# Patient Record
Sex: Male | Born: 1980 | Race: White | Hispanic: No | Marital: Single | Smoking: Never smoker
Health system: Southern US, Community
[De-identification: ages and names within clinical notes are randomized; demographics above are authoritative.]

## PROBLEM LIST (undated history)

## (undated) DIAGNOSIS — Z21 Asymptomatic human immunodeficiency virus [HIV] infection status: Secondary | ICD-10-CM

## (undated) DIAGNOSIS — B20 Human immunodeficiency virus [HIV] disease: Secondary | ICD-10-CM

## (undated) HISTORY — DX: Asymptomatic human immunodeficiency virus (hiv) infection status: Z21

## (undated) HISTORY — DX: Human immunodeficiency virus (HIV) disease: B20

---

## 2018-12-15 ENCOUNTER — Encounter: Payer: Self-pay | Admitting: Infectious Diseases

## 2018-12-20 ENCOUNTER — Other Ambulatory Visit: Payer: Self-pay | Admitting: Behavioral Health

## 2018-12-20 ENCOUNTER — Other Ambulatory Visit: Payer: Self-pay

## 2018-12-20 ENCOUNTER — Ambulatory Visit: Payer: Self-pay

## 2018-12-20 DIAGNOSIS — B20 Human immunodeficiency virus [HIV] disease: Secondary | ICD-10-CM

## 2018-12-20 DIAGNOSIS — Z79899 Other long term (current) drug therapy: Secondary | ICD-10-CM

## 2018-12-20 DIAGNOSIS — Z113 Encounter for screening for infections with a predominantly sexual mode of transmission: Secondary | ICD-10-CM

## 2018-12-21 LAB — URINALYSIS
Bilirubin Urine: NEGATIVE
Glucose, UA: NEGATIVE
Hgb urine dipstick: NEGATIVE
Ketones, ur: NEGATIVE
Leukocytes,Ua: NEGATIVE
Nitrite: NEGATIVE
Protein, ur: NEGATIVE
Specific Gravity, Urine: 1.018 (ref 1.001–1.03)
pH: 6.5 (ref 5.0–8.0)

## 2018-12-21 LAB — T-HELPER CELL (CD4) - (RCID CLINIC ONLY)
CD4 % Helper T Cell: 15 % — ABNORMAL LOW (ref 33–55)
CD4 T Cell Abs: 390 /uL — ABNORMAL LOW (ref 400–2700)

## 2018-12-21 LAB — URINE CYTOLOGY ANCILLARY ONLY
Chlamydia: NEGATIVE
Neisseria Gonorrhea: NEGATIVE

## 2018-12-21 NOTE — Progress Notes (Signed)
Does this patient have a history of prior syphilis? If not, he will need to be notified of his Dx and have his appointment moved forward, so we can determine if he's at risk for neurosyphilis and which treatment algorithm should be pursued.

## 2018-12-22 ENCOUNTER — Encounter: Payer: Self-pay | Admitting: *Deleted

## 2018-12-22 ENCOUNTER — Telehealth: Payer: Self-pay | Admitting: *Deleted

## 2018-12-22 NOTE — Telephone Encounter (Addendum)
RN left message for patient.  He called back, states he was treated mid last year, thinks it was one dose of bicillin. He feels it was in [place], [place].  He will call that provider's office and ask them to send his records to our attention.  RN sent patient MyChart Login code to his cell phone so that he can relay provider information. Andree Coss, RN   Author: Nemacolin Lions, MD Author Type: Physician Filed: 12/21/2018 10:01 AM  Note Status: Signed Cosign: Cosign Not Required Encounter Date: 12/20/2018  Editor: Marblemount Lions, MD (Physician)    Does this patient have a history of prior syphilis? If not, he will need to be notified of his Dx and have his appointment moved forward, so we can determine if he's at risk for neurosyphilis and which treatment algorithm should be pursued.

## 2018-12-26 LAB — COMPLETE METABOLIC PANEL WITH GFR
AG Ratio: 1.4 (calc) (ref 1.0–2.5)
ALT: 15 U/L (ref 9–46)
AST: 17 U/L (ref 10–40)
Albumin: 4.3 g/dL (ref 3.6–5.1)
Alkaline phosphatase (APISO): 50 U/L (ref 36–130)
BUN: 10 mg/dL (ref 7–25)
CO2: 25 mmol/L (ref 20–32)
Calcium: 9.6 mg/dL (ref 8.6–10.3)
Chloride: 107 mmol/L (ref 98–110)
Creat: 0.85 mg/dL (ref 0.60–1.35)
GFR, Est African American: 129 mL/min/{1.73_m2} (ref >=60)
GFR, Est Non African American: 111 mL/min/{1.73_m2} (ref >=60)
Globulin: 3.1 g/dL (calc) (ref 1.9–3.7)
Glucose, Bld: 82 mg/dL (ref 65–99)
Potassium: 4.2 mmol/L (ref 3.5–5.3)
Sodium: 140 mmol/L (ref 135–146)
Total Bilirubin: 0.4 mg/dL (ref 0.2–1.2)
Total Protein: 7.4 g/dL (ref 6.1–8.1)

## 2018-12-26 LAB — HEPATITIS C ANTIBODY
Hepatitis C Ab: NONREACTIVE
SIGNAL TO CUT-OFF: 0.06 (ref <1.00)

## 2018-12-26 LAB — LIPID PANEL
Cholesterol: 133 mg/dL (ref <200)
HDL: 37 mg/dL — ABNORMAL LOW (ref >=40)
LDL Cholesterol (Calc): 77 mg/dL (calc)
Non-HDL Cholesterol (Calc): 96 mg/dL (calc) (ref <130)
Total CHOL/HDL Ratio: 3.6 (calc) (ref <5.0)
Triglycerides: 109 mg/dL (ref <150)

## 2018-12-26 LAB — HIV ANTIBODY (ROUTINE TESTING W REFLEX): HIV 1&2 Ab, 4th Generation: REACTIVE — AB

## 2018-12-26 LAB — CBC WITH DIFFERENTIAL/PLATELET
Absolute Monocytes: 382 cells/uL (ref 200–950)
Basophils Absolute: 23 cells/uL (ref 0–200)
Basophils Relative: 0.4 %
Eosinophils Absolute: 80 cells/uL (ref 15–500)
Eosinophils Relative: 1.4 %
HCT: 42 % (ref 38.5–50.0)
Hemoglobin: 14.8 g/dL (ref 13.2–17.1)
Lymphs Abs: 2645 cells/uL (ref 850–3900)
MCH: 32.9 pg (ref 27.0–33.0)
MCHC: 35.2 g/dL (ref 32.0–36.0)
MCV: 93.3 fL (ref 80.0–100.0)
MPV: 11 fL (ref 7.5–12.5)
Monocytes Relative: 6.7 %
Neutro Abs: 2571 cells/uL (ref 1500–7800)
Neutrophils Relative %: 45.1 %
Platelets: 192 10*3/uL (ref 140–400)
RBC: 4.5 10*6/uL (ref 4.20–5.80)
RDW: 12.6 % (ref 11.0–15.0)
Total Lymphocyte: 46.4 %
WBC: 5.7 10*3/uL (ref 3.8–10.8)

## 2018-12-26 LAB — HEPATITIS B CORE ANTIBODY, TOTAL: Hep B Core Total Ab: NONREACTIVE

## 2018-12-26 LAB — FLUORESCENT TREPONEMAL AB(FTA)-IGG-BLD: Fluorescent Treponemal ABS: REACTIVE — AB

## 2018-12-26 LAB — QUANTIFERON-TB GOLD PLUS
Mitogen-NIL: >10 IU/mL
NIL: 0.02 IU/mL
QuantiFERON-TB Gold Plus: NEGATIVE
TB1-NIL: 0 IU/mL
TB2-NIL: 0 IU/mL

## 2018-12-26 LAB — HEPATITIS B SURFACE ANTIGEN: Hepatitis B Surface Ag: NONREACTIVE

## 2018-12-26 LAB — HEPATITIS A ANTIBODY, TOTAL: Hepatitis A AB,Total: NONREACTIVE

## 2018-12-26 LAB — RPR: RPR Ser Ql: REACTIVE — AB

## 2018-12-26 LAB — HIV-1/2 AB - DIFFERENTIATION
HIV-1 antibody: POSITIVE — AB
HIV-2 Ab: NEGATIVE

## 2018-12-26 LAB — HIV-1 RNA ULTRAQUANT REFLEX TO GENTYP+
HIV 1 RNA Quant: 273 copies/mL — ABNORMAL HIGH
HIV-1 RNA Quant, Log: 2.44 Log copies/mL — ABNORMAL HIGH

## 2018-12-26 LAB — HLA B*5701: HLA-B*5701 w/rflx HLA-B High: NEGATIVE

## 2018-12-26 LAB — RPR TITER: RPR Titer: 1:32 {titer} — ABNORMAL HIGH

## 2018-12-26 LAB — HEPATITIS B SURFACE ANTIBODY,QUALITATIVE: Hep B S Ab: NONREACTIVE

## 2018-12-26 NOTE — Telephone Encounter (Addendum)
RN left message attempting to follow up on patient's syphilis treatment history. He is scheduled in Dr Trevor Mace first available clinic day.

## 2018-12-28 NOTE — Telephone Encounter (Signed)
Albert from [facility] called stating [name] was treated for Syphilis starting 10/08/2009 in the [facility] with 3 doses of Bicillin. RPR was 1:64 at the time.  Per notes patient was treated in [place] last year.  Mathis Fare states he will do a records search for [place] to see when patient was last treated there.  He will call back with an update. Angeline Slim RN

## 2018-12-29 NOTE — Telephone Encounter (Signed)
Sure thing! I am hopeful he brings his records from [place] to the appointment 5/5, but will follow up with Mathis Fare at [facility] to see if he was able to find any treatment.

## 2018-12-29 NOTE — Telephone Encounter (Signed)
It looks more like he may have a chronically elevated titer post-treatment perhaps. It would be helpful to have his RPR titer from [place] last year in that case to confirm. Thanks for tracking this down.

## 2019-01-03 ENCOUNTER — Encounter: Payer: Self-pay | Admitting: Infectious Diseases

## 2019-01-03 ENCOUNTER — Ambulatory Visit: Payer: Self-pay | Admitting: Pharmacist

## 2019-01-03 NOTE — Telephone Encounter (Signed)
He no-showed for 2 appointment times today with me. As we now know he had a negative RPR in [place] last year, we'll need to assess him for sxs of neurosyphilis to see if he needs an LP and 2 weeks of IV PCN or 3 weekly PCN injections IM as he would be a re-treatment.

## 2019-01-03 NOTE — Telephone Encounter (Signed)
Per [facility] record search, patient tested for syphilis in [facility] 07/06/2018, RPR was negative. Andree Coss, RN

## 2019-01-04 NOTE — Telephone Encounter (Signed)
Spoke with Erie Noe at [facility] letting her know that the patient no-showed his visits and we hav enot been able to get in touch with him to start treatment for syphilis.  Per Shawna Orleans is the [facility] agent working with Yassir's case. She will let Melton Alar know and ask for her help to 1) get him into care or 2) refer to the [employer]. Andree Coss, RN

## 2019-02-11 ENCOUNTER — Emergency Department (HOSPITAL_COMMUNITY): Payer: Self-pay

## 2019-02-11 ENCOUNTER — Emergency Department (HOSPITAL_COMMUNITY)
Admission: EM | Admit: 2019-02-11 | Discharge: 2019-02-11 | Discharge status: Against Medical Advice | Payer: Self-pay | Attending: Emergency Medicine | Admitting: Emergency Medicine

## 2019-02-11 ENCOUNTER — Encounter (HOSPITAL_COMMUNITY): Payer: Self-pay | Admitting: Emergency Medicine

## 2019-02-11 DIAGNOSIS — T50901A Poisoning by unspecified drugs, medicaments and biological substances, accidental (unintentional), initial encounter: Secondary | ICD-10-CM | POA: Insufficient documentation

## 2019-02-11 DIAGNOSIS — Z5329 Procedure and treatment not carried out because of patient's decision for other reasons: Secondary | ICD-10-CM | POA: Insufficient documentation

## 2019-02-11 LAB — CBC WITH DIFFERENTIAL/PLATELET
Abs Immature Granulocytes: 0 10*3/uL (ref 0.00–0.07)
Basophils Absolute: 0 10*3/uL (ref 0.0–0.1)
Basophils Relative: 0 %
Eosinophils Absolute: 0.1 10*3/uL (ref 0.0–0.5)
Eosinophils Relative: 2 %
HCT: 42.9 % (ref 39.0–52.0)
Hemoglobin: 14.5 g/dL (ref 13.0–17.0)
Immature Granulocytes: 0 %
Lymphocytes Relative: 55 %
Lymphs Abs: 2.8 10*3/uL (ref 0.7–4.0)
MCH: 33.2 pg (ref 26.0–34.0)
MCHC: 33.8 g/dL (ref 30.0–36.0)
MCV: 98.2 fL (ref 80.0–100.0)
Monocytes Absolute: 0.5 10*3/uL (ref 0.1–1.0)
Monocytes Relative: 9 %
Neutro Abs: 1.7 10*3/uL (ref 1.7–7.7)
Neutrophils Relative %: 34 %
Platelets: 199 10*3/uL (ref 150–400)
RBC: 4.37 MIL/uL (ref 4.22–5.81)
RDW: 13.1 % (ref 11.5–15.5)
WBC: 5 10*3/uL (ref 4.0–10.5)
nRBC: 0 % (ref 0.0–0.2)

## 2019-02-11 LAB — RAPID URINE DRUG SCREEN, HOSP PERFORMED
Amphetamines: POSITIVE — AB
Barbiturates: NOT DETECTED
Benzodiazepines: NOT DETECTED
Cocaine: NOT DETECTED
Opiates: NOT DETECTED
Tetrahydrocannabinol: NOT DETECTED

## 2019-02-11 LAB — ACETAMINOPHEN LEVEL: Acetaminophen (Tylenol), Serum: <10 ug/mL — ABNORMAL LOW (ref 10–30)

## 2019-02-11 LAB — COMPREHENSIVE METABOLIC PANEL
ALT: 18 U/L (ref 0–44)
AST: 19 U/L (ref 15–41)
Albumin: 4.3 g/dL (ref 3.5–5.0)
Alkaline Phosphatase: 51 U/L (ref 38–126)
Anion gap: 9 (ref 5–15)
BUN: 13 mg/dL (ref 6–20)
CO2: 24 mmol/L (ref 22–32)
Calcium: 9.4 mg/dL (ref 8.9–10.3)
Chloride: 111 mmol/L (ref 98–111)
Creatinine, Ser: 0.86 mg/dL (ref 0.61–1.24)
GFR calc Af Amer: >60 mL/min (ref >60)
GFR calc non Af Amer: >60 mL/min (ref >60)
Glucose, Bld: 94 mg/dL (ref 70–99)
Potassium: 3.2 mmol/L — ABNORMAL LOW (ref 3.5–5.1)
Sodium: 144 mmol/L (ref 135–145)
Total Bilirubin: 0.7 mg/dL (ref 0.3–1.2)
Total Protein: 7.7 g/dL (ref 6.5–8.1)

## 2019-02-11 LAB — URINALYSIS, ROUTINE W REFLEX MICROSCOPIC
Glucose, UA: NEGATIVE mg/dL
Hgb urine dipstick: NEGATIVE
Ketones, ur: 15 mg/dL — AB
Leukocytes,Ua: NEGATIVE
Nitrite: NEGATIVE
Protein, ur: NEGATIVE mg/dL
Specific Gravity, Urine: >1.03 — ABNORMAL HIGH (ref 1.005–1.030)
pH: 6 (ref 5.0–8.0)

## 2019-02-11 LAB — ETHANOL: Alcohol, Ethyl (B): <10 mg/dL (ref <10)

## 2019-02-11 LAB — AMMONIA: Ammonia: 39 umol/L — ABNORMAL HIGH (ref 9–35)

## 2019-02-11 LAB — SALICYLATE LEVEL: Salicylate Lvl: <7 mg/dL (ref 2.8–30.0)

## 2019-02-11 LAB — TROPONIN I: Troponin I: <0.03 ng/mL (ref <0.03)

## 2019-02-11 LAB — LACTIC ACID, PLASMA: Lactic Acid, Venous: 1 mmol/L (ref 0.5–1.9)

## 2019-02-11 MED ORDER — NALOXONE HCL 2 MG/2ML IJ SOSY
PREFILLED_SYRINGE | INTRAMUSCULAR | Status: AC
  Filled 2019-02-11: qty 2

## 2019-02-11 NOTE — ED Notes (Signed)
Pt demanding to leave  Provider notified  Pt signed out [facility]

## 2019-02-11 NOTE — ED Notes (Signed)
Bed: RESB Expected date:  Expected time:  Means of arrival:  Comments: EMS Unresponsive

## 2019-02-11 NOTE — ED Provider Notes (Addendum)
[facility]   CSN: [id number] Arrival date & time: 02/11/19  [id number]    History   Chief Complaint Chief Complaint  Patient presents with  . Drug Overdose  . Unresponsive    HPI Shawn Proctor is a 38 y.o. male.     Patient brought to the emergency department by EMS for possible overdose.  Patient was reportedly at a local hotel when staff noted that he was "tearing up the room".  Police were called as well as EMS.  EMS report that at that point when they arrived at the scene, patient appeared sedated but was ambulatory in the hall and talking.  They loaded him in the ambulance and by the time he has arrived at the ER he is somnolent and obtunded. Level V Caveat due to mental status change.     History reviewed. No pertinent past medical history.  There are no active problems to display for this patient.         Home Medications    Prior to Admission medications   Not on File    Family History No family history on file.  Social History Social History   Tobacco Use  . Smoking status: Not on file  Substance Use Topics  . Alcohol use: Not on file  . Drug use: Not on file     Allergies   Patient has no allergy information on record.   Review of Systems Review of Systems  Unable to perform ROS: Mental status change     Physical Exam Updated Vital Signs BP 126/81   Pulse (!) 59   Resp 14   SpO2 99%   Physical Exam Vitals signs and nursing note reviewed.  Constitutional:      General: He is not in acute distress.    Appearance: Normal appearance. He is well-developed.  HENT:     Head: Normocephalic and atraumatic.     Right Ear: Hearing normal.     Left Ear: Hearing normal.     Nose: Nose normal.  Eyes:     Conjunctiva/sclera: Conjunctivae normal.     Pupils: Pupils are equal, round, and reactive to light.     Comments: 2-53mm, sluggish bilat  Neck:     Musculoskeletal: Normal range of motion  and neck supple.  Cardiovascular:     Rate and Rhythm: Regular rhythm.     Heart sounds: S1 normal and S2 normal. No murmur. No friction rub. No gallop.   Pulmonary:     Effort: Pulmonary effort is normal. No respiratory distress.     Breath sounds: Normal breath sounds.  Chest:     Chest wall: No tenderness.  Abdominal:     General: Bowel sounds are normal.     Palpations: Abdomen is soft.     Tenderness: There is no abdominal tenderness. There is no guarding or rebound. Negative signs include Murphy's sign and McBurney's sign.     Hernia: No hernia is present.  Musculoskeletal: Normal range of motion.  Skin:    General: Skin is warm and dry.     Findings: No rash.  Neurological:     Mental Status: He is lethargic.     GCS: GCS eye subscore is 4. GCS verbal subscore is 5. GCS motor subscore is 6.     Cranial Nerves: No cranial nerve deficit.     Sensory: No sensory deficit.     Coordination: Coordination normal.  Psychiatric:  Speech: He is noncommunicative.      ED Treatments / Results  Labs (all labs ordered are listed, but only abnormal results are displayed) Labs Reviewed  URINALYSIS, ROUTINE W REFLEX MICROSCOPIC - Abnormal; Notable for the following components:      Result Value   Color, Urine AMBER (*)    APPearance HAZY (*)    Specific Gravity, Urine >1.030 (*)    Bilirubin Urine MODERATE (*)    Ketones, ur 15 (*)    All other components within normal limits  RAPID URINE DRUG SCREEN, HOSP PERFORMED - Abnormal; Notable for the following components:   Amphetamines POSITIVE (*)    All other components within normal limits  AMMONIA - Abnormal; Notable for the following components:   Ammonia 39 (*)    All other components within normal limits  CBC WITH DIFFERENTIAL/PLATELET  LACTIC ACID, PLASMA  COMPREHENSIVE METABOLIC PANEL  TROPONIN I  ETHANOL  SALICYLATE LEVEL  ACETAMINOPHEN LEVEL    EKG EKG Interpretation  Date/Time:  Saturday February 11 2019  05:44:41 EDT Ventricular Rate:  62 PR Interval:    QRS Duration: 107 QT Interval:  399 QTC Calculation: 406 R Axis:   61 Text Interpretation:  Sinus rhythm Normal ECG Confirmed by Orpah Greek [phone]) on 02/11/2019 5:53:15 AM   Radiology No results found.  Procedures Procedures (including critical care time)  Medications Ordered in ED Medications  naloxone (NARCAN) 2 MG/2ML injection (  Hold 02/11/19 0555)     Initial Impression / Assessment and Plan / ED Course  I have reviewed the triage vital signs and the nursing notes.  Pertinent labs & imaging results that were available during my care of the patient were reviewed by me and considered in my medical decision making (see chart for details).        Patient brought into the emergency department mostly unresponsive.  EMS report that they were called to the scene of an agitated patient had a local hotel.  He was found with some pills crushed up on a plate, identity of the pills was not known.  He responded to painful stimuli at arrival but not completely wake up initially.  After he was here approximately 45 minutes he woke up on his own and was able to answer questions.  He reports that he was in the hotel with another male, not sure who it was.  He was snorting GHB.  This was recreational use, he was not intending to harm himself.  He has had waxing and waning amount of alertness here in the ER.  Poison control recommends 8 hours of observation and supportive care.  Will be monitored in the ER, signed out to oncoming ER physician to disposition when more awake and alert after appropriate amount of monitoring.  Addendum 6:50 AM -patient now fully awake and alert.  He admits to the drugs that are positive in his system as well as the GHB.  He was not trying to harm himself.  He is insistent that he leave.  I have had multiple conversations with him, as has his nurse.  He is adamant that he will not stay for further  observation.  As he was not trying to harm himself and he is fully awake and alert now, he cannot be held against his will.  He will be discharged [place].  Final Clinical Impressions(s) / ED Diagnoses   Final diagnoses:  Accidental drug overdose, initial encounter    ED Discharge Orders  None       Gilda CreasePollina, Rhianna Raulerson J, MD 02/11/19 [id number]    Gilda CreasePollina, Jovan Schickling J, MD 02/11/19 [phone]

## 2019-02-11 NOTE — ED Notes (Signed)
Called PC  8 hr observation  With fluids and benzo's for withdrawls  Watch for brady

## 2019-02-11 NOTE — ED Notes (Signed)
Patient transported to CT 

## 2019-02-11 NOTE — ED Notes (Signed)
EKG given to EDP, Pollina,MD. For review. 

## 2019-02-11 NOTE — ED Triage Notes (Signed)
Police called for tearing up room at motel, ems arrives found pt unresponsive, sweaty,  Pt found in motel unresponsive tonight/ this morning. Pt brought in by ems.

## 2019-03-16 ENCOUNTER — Ambulatory Visit: Payer: Self-pay

## 2019-03-16 ENCOUNTER — Encounter: Payer: Self-pay | Admitting: Family

## 2019-03-16 ENCOUNTER — Other Ambulatory Visit: Payer: Self-pay

## 2019-03-16 ENCOUNTER — Ambulatory Visit (INDEPENDENT_AMBULATORY_CARE_PROVIDER_SITE_OTHER): Payer: Self-pay | Admitting: Pharmacist

## 2019-03-16 ENCOUNTER — Ambulatory Visit (INDEPENDENT_AMBULATORY_CARE_PROVIDER_SITE_OTHER): Payer: Self-pay | Admitting: Family

## 2019-03-16 VITALS — BP 139/88 | HR 89 | Temp 98.5°F | Wt 197.0 lb

## 2019-03-16 DIAGNOSIS — B20 Human immunodeficiency virus [HIV] disease: Secondary | ICD-10-CM

## 2019-03-16 DIAGNOSIS — A515 Early syphilis, latent: Secondary | ICD-10-CM | POA: Insufficient documentation

## 2019-03-16 DIAGNOSIS — Z113 Encounter for screening for infections with a predominantly sexual mode of transmission: Secondary | ICD-10-CM | POA: Insufficient documentation

## 2019-03-16 MED ORDER — PENICILLIN G BENZATHINE 1200000 UNIT/2ML IM SUSP
1.2000 10*6.[IU] | Freq: Once | INTRAMUSCULAR | Status: AC
  Administered 2019-03-16: 1.2 10*6.[IU] via INTRAMUSCULAR

## 2019-03-16 MED ORDER — BICTEGRAVIR-EMTRICITAB-TENOFOV 50-200-25 MG PO TABS: 1.0000 | ORAL_TABLET | Freq: Every day | ORAL | 2 refills | Status: DC

## 2019-03-16 NOTE — Progress Notes (Signed)
Subjective:    Patient ID: Shawn Proctor, male    DOB: 12-05-1980, 38 y.o.   MRN: [id number]  Chief Complaint  Patient presents with  . New Patient (Initial Visit)    HPI:  Shawn Proctor is a 38 y.o. male with previous medical history of HIV disease presenting today to transfer/establish care for his HIV disease.  Shawn Proctor was initially diagnosed with HIV-1 prior being incarcerated in 2019. He thinks he may have been infected prior to that but is not sure. Risk factor for acquiring HIV is MSM.  Initial CD4 nadir was 139 with a viral load of 139.  Initial genotype with wild-type virus and no significant resistance.  Upon entry to incarceration he was started on Tivicay and Descovy with Bactrim for OI prophylaxis. Previously cared for in [place] and has now moved to [place]. He had been off his Tivicay and Descovy and recently been taking his roommates Biktarvy for the past 2 months but ran out last week. No adverse side effects when taking the Biktarvy.   Shawn Proctor completed his initial clinic blood work on 12/20/2018 with a viral load of 273 and CD4 count of 390.  Syphilis titer was positive at 1: 32.  Negative for acute infection with hepatitis B or C and non-serologically immune to hepatitis B.  Hepatic function, renal function, and electrolytes within normal ranges.   Shawn Proctor is not currently working but seeking work.  He is receiving financial assistance through [employer].  Denies feelings of being down, depressed, or hopeless recently.  He has quit tobacco use.  He does have recreational/illicit drug use which he wishes not to disclose at this time.  No current alcohol use.  He does remain sexually active and uses condoms.  He has concerns that he may have been exposed to syphilis as well as gonorrhea/chlamydia.    Allergies  Allergen Reactions  . Clindamycin/Lincomycin       Outpatient Medications Prior to Visit  Medication Sig Dispense Refill  .  Bictegravir-Emtricitab-Tenofov (BIKTARVY PO) Take by mouth.     No facility-administered medications prior to visit.      History reviewed. No pertinent past medical history.    History reviewed. No pertinent surgical history.    History reviewed. No pertinent family history.    Social History   Socioeconomic History  . Marital status: Single    Spouse name: Not on file  . Number of children: Not on file  . Years of education: Not on file  . Highest education level: Not on file  Occupational History  . Not on file  Social Needs  . Financial resource strain: Not on file  . Food insecurity    Worry: Not on file    Inability: Not on file  . Transportation needs    Medical: Not on file    Non-medical: Not on file  Tobacco Use  . Smoking status: Never Smoker  . Smokeless tobacco: Never Used  Substance and Sexual Activity  . Alcohol use: Not Currently  . Drug use: Not Currently  . Sexual activity: Not on file  Lifestyle  . Physical activity    Days per week: Not on file    Minutes per session: Not on file  . Stress: Not on file  Relationships  . Social Herbalist on phone: Not on file    Gets together: Not on file    Attends religious service: Not on file    Active  member of club or organization: Not on file    Attends meetings of clubs or organizations: Not on file    Relationship status: Not on file  . Intimate partner violence    Fear of current or ex partner: Not on file    Emotionally abused: Not on file    Physically abused: Not on file    Forced sexual activity: Not on file  Other Topics Concern  . Not on file  Social History Narrative  . Not on file      Review of Systems  Constitutional: Negative for appetite change, chills, fatigue, fever and unexpected weight change.  Eyes: Negative for visual disturbance.  Respiratory: Negative for cough, chest tightness, shortness of breath and wheezing.   Cardiovascular: Negative for chest  pain and leg swelling.  Gastrointestinal: Negative for abdominal pain, constipation, diarrhea, nausea and vomiting.  Genitourinary: Negative for dysuria, flank pain, frequency, genital sores, hematuria and urgency.  Skin: Negative for rash.  Allergic/Immunologic: Negative for immunocompromised state.  Neurological: Negative for dizziness and headaches.       Objective:    BP 139/88   Pulse 89   Temp 98.5 F (36.9 C) (Oral)   Wt 197 lb (89.4 kg)  Nursing note and vital signs reviewed.  Physical Exam Constitutional:      General: He is not in acute distress.    Appearance: He is well-developed.  Eyes:     Conjunctiva/sclera: Conjunctivae normal.  Neck:     Musculoskeletal: Neck supple.  Cardiovascular:     Rate and Rhythm: Normal rate and regular rhythm.     Heart sounds: Normal heart sounds. No murmur. No friction rub. No gallop.   Pulmonary:     Effort: Pulmonary effort is normal. No respiratory distress.     Breath sounds: Normal breath sounds. No wheezing or rales.  Chest:     Chest wall: No tenderness.  Abdominal:     General: Bowel sounds are normal.     Palpations: Abdomen is soft.     Tenderness: There is no abdominal tenderness.  Lymphadenopathy:     Cervical: No cervical adenopathy.  Skin:    General: Skin is warm and dry.     Findings: No rash.  Neurological:     Mental Status: He is alert and oriented to person, place, and time.  Psychiatric:        Behavior: Behavior normal.        Thought Content: Thought content normal.        Judgment: Judgment normal.         Assessment & Plan:   Problem List Items Addressed This Visit      Other   HIV disease ([place]) - Primary    Shawn Proctor was initially diagnosed with HIV-1 in 2019 with initial viral load of 3.61 million and CD4 nadir of 139. Initial genotype without significant resistance and was treated with Tivicay and Descovy. Now on Biktarvy which he has been out of for 1 month his most recent viral load  was 273 CD4 count of 390. He tolerated the Biktarvy with no adverse side effects. No current symptoms of opportunistic infection or progressive HIV disease. Discussed clinic overview and met with pharmacy staff. Restart Biktarvy. Check blood work today. Plan for follow up in 2 months or sooner if needed.       Relevant Medications   bictegravir-emtricitabine-tenofovir AF (BIKTARVY) 50-200-25 MG TABS tablet   Other Relevant Orders   COMPLETE METABOLIC PANEL WITH  GFR   T-helper cell (CD4)- ([facility] clinic only)   HIV-1 RNA quant-no reflex-bld   Early syphilis, latent    Mr. Rehm had a positive RPR titer in on 12/20/18 at 1:32 with most recent RPR titer being non-reactive in both November 2019 and also January 2019. He has no current symptoms and would classify this as early latent syphilis. Will treat with 2.4 million units of Bicillin IM once. Advised to avoid sexual contact for the next 7 days. Recheck RPR in 3 months.       Relevant Medications   bictegravir-emtricitabine-tenofovir AF (BIKTARVY) 50-200-25 MG TABS tablet   Screening for STDs (sexually transmitted diseases)    Mr. Nabers has concern for gonorrhea/chlamydia. We will obtain oral and rectal swabs to rule out infection. Initial plan to treat today, however he became dizzy following his Bicillin injections. Will have him schedule a nurse visit on Monday, 7/20, for azithromycin 1 gram PO once and ceftriaxone 250 mg IM once.       Relevant Orders   Cytology (oral, anal, urethral) ancillary only   Cytology (oral, anal, urethral) ancillary only       I have discontinued Ponciano Ort Bictegravir-Emtricitab-Tenofov (BIKTARVY PO). I am also having him start on bictegravir-emtricitabine-tenofovir AF. We administered penicillin g benzathine and penicillin g benzathine.   Meds ordered this encounter  Medications  . bictegravir-emtricitabine-tenofovir AF (BIKTARVY) 50-200-25 MG TABS tablet    Sig: Take 1 tablet by mouth daily.     Dispense:  30 tablet    Refill:  2    Order Specific Question:   Supervising Provider    Answer:   Carlyle Basques [[id number]]  . penicillin g benzathine (BICILLIN LA) 1200000 UNIT/2ML injection 1.2 Million Units  . penicillin g benzathine (BICILLIN LA) 1200000 UNIT/2ML injection 1.2 Million Units     Follow-up: Return in about 2 months (around 05/17/2019), or if symptoms worsen or fail to improve.    Terri Piedra, MSN, FNP-C Nurse Practitioner [facility] Office phone: [phone] Pager: [place] number: [phone]

## 2019-03-16 NOTE — Assessment & Plan Note (Signed)
Mr. Gladwin has concern for gonorrhea/chlamydia. We will obtain oral and rectal swabs to rule out infection. Initial plan to treat today, however he became dizzy following his Bicillin injections. Will have him schedule a nurse visit on Monday, 7/20, for azithromycin 1 gram PO once and ceftriaxone 250 mg IM once.

## 2019-03-16 NOTE — Assessment & Plan Note (Signed)
Mr. Asebedo had a positive RPR titer in on 12/20/18 at 1:32 with most recent RPR titer being non-reactive in both November 2019 and also January 2019. He has no current symptoms and would classify this as early latent syphilis. Will treat with 2.4 million units of Bicillin IM once. Advised to avoid sexual contact for the next 7 days. Recheck RPR in 3 months.

## 2019-03-16 NOTE — Progress Notes (Signed)
HPI: Shawn Proctor is a 38 y.o. male who presents to the [facility] clinic to initiate care with Tammy SoursGreg for his HIV infection.  Patient Active Problem List   Diagnosis Date Noted  . HIV disease (HCC) 03/16/2019  . Early syphilis, latent 03/16/2019  . Screening for STDs (sexually transmitted diseases) 03/16/2019    Patient's Medications  New Prescriptions   No medications on file  Previous Medications   BICTEGRAVIR-EMTRICITABINE-TENOFOVIR AF (BIKTARVY) 50-200-25 MG TABS TABLET    Take 1 tablet by mouth daily.  Modified Medications   No medications on file  Discontinued Medications   No medications on file    Allergies: Allergies  Allergen Reactions  . Clindamycin/Lincomycin     Past Medical History: No past medical history on file.  Social History: Social History   Socioeconomic History  . Marital status: Single    Spouse name: Not on file  . Number of children: Not on file  . Years of education: Not on file  . Highest education level: Not on file  Occupational History  . Not on file  Social Needs  . Financial resource strain: Not on file  . Food insecurity    Worry: Not on file    Inability: Not on file  . Transportation needs    Medical: Not on file    Non-medical: Not on file  Tobacco Use  . Smoking status: Never Smoker  . Smokeless tobacco: Never Used  Substance and Sexual Activity  . Alcohol use: Not Currently  . Drug use: Not Currently  . Sexual activity: Not on file  Lifestyle  . Physical activity    Days per week: Not on file    Minutes per session: Not on file  . Stress: Not on file  Relationships  . Social Musicianconnections    Talks on phone: Not on file    Gets together: Not on file    Attends religious service: Not on file    Active member of club or organization: Not on file    Attends meetings of clubs or organizations: Not on file    Relationship status: Not on file  Other Topics Concern  . Not on file  Social History Narrative  . Not on file     Labs: Lab Results  Component Value Date   HIV1RNAQUANT 273 (H) 12/20/2018   CD4TABS 390 (L) 12/20/2018    RPR and STI Lab Results  Component Value Date   LABRPR REACTIVE (A) 12/20/2018   RPRTITER 1:32 (H) 12/20/2018    STI Results GC CT  12/20/2018 Negative Negative    Hepatitis B Lab Results  Component Value Date   HEPBSAB NON-REACTIVE 12/20/2018   HEPBSAG NON-REACTIVE 12/20/2018   HEPBCAB NON-REACTIVE 12/20/2018   Hepatitis C Lab Results  Component Value Date   HEPCAB NON-REACTIVE 12/20/2018   Hepatitis A Lab Results  Component Value Date   HAV NON-REACTIVE 12/20/2018   Lipids: Lab Results  Component Value Date   CHOL 133 12/20/2018   TRIG 109 12/20/2018   HDL 37 (L) 12/20/2018   CHOLHDL 3.6 12/20/2018   LDLCALC 77 12/20/2018     Assessment: Shawn Proctor is here today to initiate care with Tammy SoursGreg.  He was previously on Tivicay and Descovy but has been taking his roommate's Biktarvy for several months.  He has been out for a week. Will continue patient on Biktarvy.  Explained that Susanne BordersBiktarvy is a one pill once daily medication with or without food and the importance of not missing any  doses. Explained resistance and how it develops and why it is so important to take Biktarvy daily and not skip days or doses. Counseled patient to take it around the same time each day. Counseled on what to do if dose is missed, if closer to missed dose take immediately, if closer to next dose then skip and resume normal schedule.   Cautioned on possible side effects the first week or so including nausea, diarrhea, dizziness, and headaches but that they should resolve after the first couple of weeks. I reviewed patient medications and found no drug interactions. Counseled patient to separate Biktarvy from divalent cations including multivitamins. Discussed with patient to call clinic if he starts a new medication or herbal supplement. I gave the patient my card and told he to call me with  any issues/questions/concerns.  Patient is approved for [employer] and will get his [place] at [facility].  Plan: - Continue Biktarvy PO once daily  Tempie Gibeault L. Neena Beecham, PharmD, BCIDP, AAHIVP, [facility] Infect[facility] 03/16/2019, 3:58 PM

## 2019-03-16 NOTE — Patient Instructions (Signed)
Nice to meet you.  We will check your blood work today.  Restart taking the Biktarvy.  We will recheck your RPR for syphilis in 3 months.   Biktarvy has been sent to the pharmacy.  Plan for follow up in 2 months or sooner if needed. Lab work 1-2 weeks prior to your appointment.   Have a great day and stay safe!

## 2019-03-16 NOTE — Assessment & Plan Note (Signed)
Shawn Proctor was initially diagnosed with HIV-1 in 2019 with initial viral load of 3.61 million and CD4 nadir of 139. Initial genotype without significant resistance and was treated with Tivicay and Descovy. Now on Biktarvy which he has been out of for 1 month his most recent viral load was 273 CD4 count of 390. He tolerated the Biktarvy with no adverse side effects. No current symptoms of opportunistic infection or progressive HIV disease. Discussed clinic overview and met with pharmacy staff. Restart Biktarvy. Check blood work today. Plan for follow up in 2 months or sooner if needed.

## 2019-03-17 ENCOUNTER — Other Ambulatory Visit: Payer: Self-pay | Admitting: *Deleted

## 2019-03-17 DIAGNOSIS — Z113 Encounter for screening for infections with a predominantly sexual mode of transmission: Secondary | ICD-10-CM

## 2019-03-17 DIAGNOSIS — B20 Human immunodeficiency virus [HIV] disease: Secondary | ICD-10-CM

## 2019-03-20 ENCOUNTER — Other Ambulatory Visit: Payer: Self-pay

## 2019-03-20 ENCOUNTER — Ambulatory Visit (INDEPENDENT_AMBULATORY_CARE_PROVIDER_SITE_OTHER): Payer: Self-pay | Admitting: *Deleted

## 2019-03-20 DIAGNOSIS — B20 Human immunodeficiency virus [HIV] disease: Secondary | ICD-10-CM

## 2019-03-20 DIAGNOSIS — A64 Unspecified sexually transmitted disease: Secondary | ICD-10-CM

## 2019-03-20 MED ORDER — CEFTRIAXONE SODIUM 250 MG IJ SOLR
250.0000 mg | Freq: Once | INTRAMUSCULAR | Status: AC
  Administered 2019-03-20: 250 mg via INTRAMUSCULAR

## 2019-03-20 MED ORDER — AZITHROMYCIN 250 MG PO TABS
1000.0000 mg | ORAL_TABLET | Freq: Once | ORAL | Status: AC
  Administered 2019-03-20: 15:00:00 1000 mg via ORAL

## 2019-03-21 LAB — CYTOLOGY, (ORAL, ANAL, URETHRAL) ANCILLARY ONLY
Chlamydia: NEGATIVE
Neisseria Gonorrhea: NEGATIVE

## 2019-03-21 LAB — T-HELPER CELL (CD4) - (RCID CLINIC ONLY)
CD4 % Helper T Cell: 16 % — ABNORMAL LOW (ref 33–65)
CD4 T Cell Abs: 418 /uL (ref 400–1790)

## 2019-03-22 LAB — CYTOLOGY, (ORAL, ANAL, URETHRAL) ANCILLARY ONLY
Chlamydia: NEGATIVE
Neisseria Gonorrhea: NEGATIVE

## 2019-03-24 LAB — COMPLETE METABOLIC PANEL WITH GFR
AG Ratio: 1.5 (calc) (ref 1.0–2.5)
ALT: 19 U/L (ref 9–46)
AST: 19 U/L (ref 10–40)
Albumin: 4.5 g/dL (ref 3.6–5.1)
Alkaline phosphatase (APISO): 57 U/L (ref 36–130)
BUN: 16 mg/dL (ref 7–25)
CO2: 23 mmol/L (ref 20–32)
Calcium: 9.8 mg/dL (ref 8.6–10.3)
Chloride: 107 mmol/L (ref 98–110)
Creat: 0.84 mg/dL (ref 0.60–1.35)
GFR, Est African American: 130 mL/min/{1.73_m2} (ref >=60)
GFR, Est Non African American: 112 mL/min/{1.73_m2} (ref >=60)
Globulin: 3 g/dL (calc) (ref 1.9–3.7)
Glucose, Bld: 74 mg/dL (ref 65–99)
Potassium: 4.2 mmol/L (ref 3.5–5.3)
Sodium: 143 mmol/L (ref 135–146)
Total Bilirubin: 0.6 mg/dL (ref 0.2–1.2)
Total Protein: 7.5 g/dL (ref 6.1–8.1)

## 2019-03-24 LAB — HIV-1 RNA QUANT-NO REFLEX-BLD
HIV 1 RNA Quant: 164 copies/mL — ABNORMAL HIGH
HIV-1 RNA Quant, Log: 2.21 Log copies/mL — ABNORMAL HIGH

## 2019-03-29 ENCOUNTER — Ambulatory Visit: Payer: Self-pay | Admitting: Infectious Diseases

## 2019-04-06 ENCOUNTER — Encounter: Payer: Self-pay | Admitting: Infectious Diseases

## 2019-06-01 ENCOUNTER — Encounter: Payer: Self-pay | Admitting: Family

## 2019-06-15 ENCOUNTER — Other Ambulatory Visit: Payer: Self-pay

## 2019-06-19 ENCOUNTER — Other Ambulatory Visit: Payer: Self-pay | Admitting: *Deleted

## 2019-06-19 ENCOUNTER — Other Ambulatory Visit: Payer: Self-pay

## 2019-06-19 DIAGNOSIS — A515 Early syphilis, latent: Secondary | ICD-10-CM

## 2019-06-19 DIAGNOSIS — B20 Human immunodeficiency virus [HIV] disease: Secondary | ICD-10-CM

## 2019-06-20 LAB — T-HELPER CELL (CD4) - (RCID CLINIC ONLY)
CD4 % Helper T Cell: 16 % — ABNORMAL LOW (ref 33–65)
CD4 T Cell Abs: 373 /uL — ABNORMAL LOW (ref 400–1790)

## 2019-06-22 LAB — RPR TITER: RPR Titer: 1:4 {titer} — ABNORMAL HIGH

## 2019-06-22 LAB — CBC WITH DIFFERENTIAL/PLATELET
Absolute Monocytes: 410 cells/uL (ref 200–950)
Basophils Absolute: 22 cells/uL (ref 0–200)
Basophils Relative: 0.4 %
Eosinophils Absolute: 59 cells/uL (ref 15–500)
Eosinophils Relative: 1.1 %
HCT: 47.1 % (ref 38.5–50.0)
Hemoglobin: 16.1 g/dL (ref 13.2–17.1)
Lymphs Abs: 2306 cells/uL (ref 850–3900)
MCH: 33.3 pg — ABNORMAL HIGH (ref 27.0–33.0)
MCHC: 34.2 g/dL (ref 32.0–36.0)
MCV: 97.5 fL (ref 80.0–100.0)
MPV: 10.7 fL (ref 7.5–12.5)
Monocytes Relative: 7.6 %
Neutro Abs: 2603 cells/uL (ref 1500–7800)
Neutrophils Relative %: 48.2 %
Platelets: 198 10*3/uL (ref 140–400)
RBC: 4.83 10*6/uL (ref 4.20–5.80)
RDW: 13.1 % (ref 11.0–15.0)
Total Lymphocyte: 42.7 %
WBC: 5.4 10*3/uL (ref 3.8–10.8)

## 2019-06-22 LAB — FLUORESCENT TREPONEMAL AB(FTA)-IGG-BLD: Fluorescent Treponemal ABS: REACTIVE — AB

## 2019-06-22 LAB — COMPLETE METABOLIC PANEL WITH GFR
AG Ratio: 1.6 (calc) (ref 1.0–2.5)
ALT: 167 U/L — ABNORMAL HIGH (ref 9–46)
AST: 65 U/L — ABNORMAL HIGH (ref 10–40)
Albumin: 4.4 g/dL (ref 3.6–5.1)
Alkaline phosphatase (APISO): 71 U/L (ref 36–130)
BUN: 15 mg/dL (ref 7–25)
CO2: 22 mmol/L (ref 20–32)
Calcium: 9.7 mg/dL (ref 8.6–10.3)
Chloride: 107 mmol/L (ref 98–110)
Creat: 0.78 mg/dL (ref 0.60–1.35)
GFR, Est African American: 133 mL/min/{1.73_m2} (ref >=60)
GFR, Est Non African American: 115 mL/min/{1.73_m2} (ref >=60)
Globulin: 2.7 g/dL (calc) (ref 1.9–3.7)
Glucose, Bld: 84 mg/dL (ref 65–99)
Potassium: 4.4 mmol/L (ref 3.5–5.3)
Sodium: 140 mmol/L (ref 135–146)
Total Bilirubin: 0.4 mg/dL (ref 0.2–1.2)
Total Protein: 7.1 g/dL (ref 6.1–8.1)

## 2019-06-22 LAB — RPR: RPR Ser Ql: REACTIVE — AB

## 2019-06-22 LAB — HIV-1 RNA QUANT-NO REFLEX-BLD
HIV 1 RNA Quant: 36 copies/mL — ABNORMAL HIGH
HIV-1 RNA Quant, Log: 1.56 Log copies/mL — ABNORMAL HIGH

## 2019-06-29 ENCOUNTER — Encounter: Payer: Self-pay | Admitting: Family

## 2019-07-03 ENCOUNTER — Other Ambulatory Visit: Payer: Self-pay | Admitting: Family

## 2019-07-03 DIAGNOSIS — B20 Human immunodeficiency virus [HIV] disease: Secondary | ICD-10-CM

## 2019-07-06 ENCOUNTER — Encounter: Payer: Self-pay | Admitting: Family

## 2019-07-06 ENCOUNTER — Other Ambulatory Visit: Payer: Self-pay

## 2019-07-06 ENCOUNTER — Ambulatory Visit (INDEPENDENT_AMBULATORY_CARE_PROVIDER_SITE_OTHER): Payer: Self-pay | Admitting: Family

## 2019-07-06 DIAGNOSIS — A515 Early syphilis, latent: Secondary | ICD-10-CM

## 2019-07-06 DIAGNOSIS — K047 Periapical abscess without sinus: Secondary | ICD-10-CM

## 2019-07-06 DIAGNOSIS — Z113 Encounter for screening for infections with a predominantly sexual mode of transmission: Secondary | ICD-10-CM

## 2019-07-06 DIAGNOSIS — B20 Human immunodeficiency virus [HIV] disease: Secondary | ICD-10-CM

## 2019-07-06 DIAGNOSIS — Z0000 Encounter for general adult medical examination without abnormal findings: Secondary | ICD-10-CM

## 2019-07-06 MED ORDER — AMOXICILLIN-POT CLAVULANATE 875-125 MG PO TABS: 1.0000 | ORAL_TABLET | Freq: Two times a day (BID) | ORAL | 0 refills | Status: DC

## 2019-07-06 NOTE — Assessment & Plan Note (Signed)
   Due for influenza, Prevnar, Menveo, Pneumovax, and hepatitis B vaccinations.  He will schedule a nurse visit to complete these.  Discussed importance of safe sexual practice to reduce risk of STI.  Courage to follow-up with dentistry for dental abscess/routine cleanings.

## 2019-07-06 NOTE — Assessment & Plan Note (Signed)
[name] sounds to have symptoms that are consistent with a dental abscess.  Will prescribe Augmentin 875/125 for 10 days with encouragement to seek dental care for further alleviation of symptoms if they do not fully improve.  May also consider salt water gargles for comfort purposes.

## 2019-07-06 NOTE — Patient Instructions (Signed)
Nice to speak with you.   We will continue your Biktarvy.  Please schedule a nurse visit to get your vaccinations and a lab visit in about 3 months for follow up.  Augmentin has been sent to the pharmacy for your dental abscess.   Plan for follow up in 3 months or sooner if needed with lab work prior to your appointment. We can continue to do video visits if you'd like.  Have a great day and stay safe!

## 2019-07-06 NOTE — Progress Notes (Signed)
Subjective:    Patient ID: Shawn Proctor, male    DOB: [birth year], 38 y.o.   MRN: [id number]  Chief Complaint  Patient presents with  . HIV Positive/AIDS     Virtual Visit via Telephone Note   I connected with [name] on 07/06/2019 at 16:30  by telephone and verified that I am speaking with the correct person using two identifiers.   I discussed the limitations, risks, security and privacy concerns of performing an evaluation and management service by telephone and the availability of in person appointments. I also discussed with the patient that there may be a patient responsible charge related to this service. The patient expressed understanding and agreed to proceed.   HPI:  Shawn Proctor is a 38 y.o. male with HIV disease who was last seen in the office on 03/16/2019 with good adherence and tolerance to his ART regimen of Biktarvy.  Viral load at the time was 164 with a CD4 count of 418.  Most recent blood work completed on 06/19/2019 with a CD4 count of 373 and a viral load of 32.  Liver function test slightly elevated with AST of 65 and ALT of 167.  Renal function electrolytes within normal ranges.  RPR positive at 1: 4 down from treatment level of 1: 32.  Shawn Proctor continues to take his Biktarvy as prescribed no adverse side effects or missed doses since his last office visit.  Overall doing well, however recently began experiencing dental pain within the last 2 to 3 days and noted abscess in his mouth with some drainage.  Unfortunately does not have access to a dentist at the present time and is requesting antibiotics. Denies fevers, chills, night sweats, headaches, changes in vision, neck pain/stiffness, nausea, diarrhea, vomiting, lesions or rashes.  Shawn Proctor has no problems obtaining his medication from the pharmacy and remains covered through [employer].  Denies feelings of being down, depressed, or hopeless recently.  No recreational or illicit drug use, tobacco use,  or alcohol consumption.  He is not currently sexually active.  He is contemplating moving to [place] but interested in keeping care here at [facility] at the present time.    Allergies  Allergen Reactions  . Clindamycin/Lincomycin       Outpatient Medications Prior to Visit  Medication Sig Dispense Refill  . BIKTARVY 50-200-25 MG TABS tablet TAKE 1 TABLET BY MOUTH DAILY 30 tablet 2   No facility-administered medications prior to visit.      History reviewed. No pertinent past medical history.   History reviewed. No pertinent surgical history.     Review of Systems  Constitutional: Negative for appetite change, chills, fatigue, fever and unexpected weight change.  HENT: Positive for dental problem.   Eyes: Negative for visual disturbance.  Respiratory: Negative for cough, chest tightness, shortness of breath and wheezing.   Cardiovascular: Negative for chest pain and leg swelling.  Gastrointestinal: Negative for abdominal pain, constipation, diarrhea, nausea and vomiting.  Genitourinary: Negative for dysuria, flank pain, frequency, genital sores, hematuria and urgency.  Skin: Negative for rash.  Allergic/Immunologic: Negative for immunocompromised state.  Neurological: Negative for dizziness and headaches.      Objective:    Nursing note and vital signs reviewed.    Shawn Proctor is pleasant to speak with and sounds to be doing well.  From his description it sounds as though he has an abscess located in his lower mouth.  Other physical exam deferred due to phone visit.  Assessment & Plan:  Problem List Items Addressed This Visit      Digestive   Dental abscess    Shawn Proctor sounds to have symptoms that are consistent with a dental abscess.  Will prescribe Augmentin 875/125 for 10 days with encouragement to seek dental care for further alleviation of symptoms if they do not fully improve.  May also consider salt water gargles for comfort purposes.        Other   HIV  disease [facility])    Shawn Proctor appears to have well-controlled HIV disease with good adherence and tolerance to his ART regimen of Biktarvy.  No signs/symptoms of opportunistic infection or progressive HIV disease.  He has no problems obtaining his medication from the pharmacy.  Reviewed lab work and discussed plan of care.  Continue current dose of Biktarvy.  He is due for several immunizations which she will schedule a nurse visit for.  Plan for follow-up in 3 months or sooner if needed with lab work 1 to 2 weeks prior to appointment.      Relevant Orders   3 month T-helper cell (CD4)- ([facility] clinic only)   3 month VL   3 month CBC   3 month CMP   Early syphilis, latent    Early latent syphilis treated with 2,400,000 units of Bicillin successfully with new RPR titer of 1: 4 down from 1: 32 of treatment.  Continue to monitor RPR periodically.      Screening for STDs (sexually transmitted diseases) - Primary   Relevant Orders   3 month RPR   Urine cytology ancillary only([place])   Healthcare maintenance     Due for influenza, Prevnar, Menveo, Pneumovax, and hepatitis B vaccinations.  He will schedule a nurse visit to complete these.  Discussed importance of safe sexual practice to reduce risk of STI.  Courage to follow-up with dentistry for dental abscess/routine cleanings.          I am having Shawn Proctor start on amoxicillin-clavulanate. I am also having him maintain his Biktarvy.   Meds ordered this encounter  Medications  . amoxicillin-clavulanate (AUGMENTIN) 875-125 MG tablet    Sig: Take 1 tablet by mouth 2 (two) times daily.    Dispense:  20 tablet    Refill:  0    Order Specific Question:   Supervising Provider    Answer:   Judyann Munson [phone]     I discussed the assessment and treatment plan with the patient. The patient was provided an opportunity to ask questions and all were answered. The patient agreed with the plan and demonstrated an understanding of  the instructions.   The patient was advised to call back or seek an in-person evaluation if the symptoms worsen or if the condition fails to improve as anticipated.   I provided 15   minutes of non-face-to-face time during this encounter.  Follow-up: Return in about 3 months (around 10/06/2019), or if symptoms worsen or fail to improve.   Marcos Eke, MSN, FNP-C Nurse Practitioner [facility] for Infectious Disease [facility] Main number: [phone]

## 2019-07-06 NOTE — Assessment & Plan Note (Signed)
Early latent syphilis treated with 2,400,000 units of Bicillin successfully with new RPR titer of 1: 4 down from 1: 32 of treatment.  Continue to monitor RPR periodically.

## 2019-07-06 NOTE — Assessment & Plan Note (Signed)
Shawn Proctor appears to have well-controlled HIV disease with good adherence and tolerance to his ART regimen of Biktarvy.  No signs/symptoms of opportunistic infection or progressive HIV disease.  He has no problems obtaining his medication from the pharmacy.  Reviewed lab work and discussed plan of care.  Continue current dose of Biktarvy.  He is due for several immunizations which she will schedule a nurse visit for.  Plan for follow-up in 3 months or sooner if needed with lab work 1 to 2 weeks prior to appointment.

## 2019-07-18 MED ORDER — VALACYCLOVIR HCL 1 G PO TABS: 1000.0000 mg | ORAL_TABLET | Freq: Two times a day (BID) | ORAL | 0 refills | Status: DC

## 2019-07-18 NOTE — Telephone Encounter (Signed)
Valtrex sent to pharmacy. Current limited evidence shows there is no difference for acquiring Coronavirus between HIV infected and non-HIV infected individuals. Best thing to do is to continue to take antiretrovirals and continue to follow the 3 Ws.

## 2019-07-31 MED ORDER — VALACYCLOVIR HCL 1 G PO TABS: 1000.0000 mg | ORAL_TABLET | Freq: Three times a day (TID) | ORAL | 0 refills | Status: DC

## 2019-08-23 ENCOUNTER — Telehealth: Payer: Self-pay

## 2019-08-23 ENCOUNTER — Other Ambulatory Visit: Payer: Self-pay | Admitting: Family

## 2019-08-23 NOTE — Telephone Encounter (Signed)
Ok to refill 

## 2019-08-23 NOTE — Telephone Encounter (Signed)
Called patient after receiving refill request for Augmentin. Called patient to see if he still had issues with dental abscess.  Spoke with patient who states abscess is back. Is waiting to see dentist in January.  Will forward message to FNP to advise if refill is okay. [place]

## 2019-09-19 ENCOUNTER — Telehealth: Payer: Self-pay

## 2019-09-19 ENCOUNTER — Other Ambulatory Visit: Payer: Self-pay | Admitting: Family

## 2019-09-19 DIAGNOSIS — B20 Human immunodeficiency virus [HIV] disease: Secondary | ICD-10-CM

## 2019-09-19 NOTE — Telephone Encounter (Signed)
Reached out to patient regarding the need for appointment with provider/labs and also to renew ADAP with financial department. LPN was able to schedule patient appointment. Patient states he is working on paper work received by his [employer] with [employer] to return to financial department.  Refills approved.  Valarie Cones

## 2019-09-25 ENCOUNTER — Other Ambulatory Visit: Payer: Self-pay

## 2019-10-02 ENCOUNTER — Other Ambulatory Visit: Payer: Self-pay

## 2019-10-04 ENCOUNTER — Ambulatory Visit: Payer: Self-pay | Admitting: Family

## 2019-10-19 ENCOUNTER — Other Ambulatory Visit: Payer: Self-pay | Admitting: Family

## 2019-10-19 ENCOUNTER — Other Ambulatory Visit: Payer: Self-pay

## 2019-10-19 MED ORDER — VALACYCLOVIR HCL 1 G PO TABS: 1000.0000 mg | ORAL_TABLET | Freq: Three times a day (TID) | ORAL | 0 refills | Status: DC

## 2019-10-23 ENCOUNTER — Ambulatory Visit: Payer: Self-pay | Admitting: Family

## 2019-10-23 ENCOUNTER — Other Ambulatory Visit: Payer: Self-pay | Admitting: Family

## 2019-11-21 ENCOUNTER — Encounter: Payer: Self-pay | Admitting: Family

## 2019-12-02 IMAGING — DX PORTABLE CHEST - 1 VIEW
1 series · 1 of 1 positions shown · non-contrast
Comparison: None.

CLINICAL DATA: Patient found unresponsive.

EXAM:
PORTABLE CHEST 1 VIEW

[chest ap]
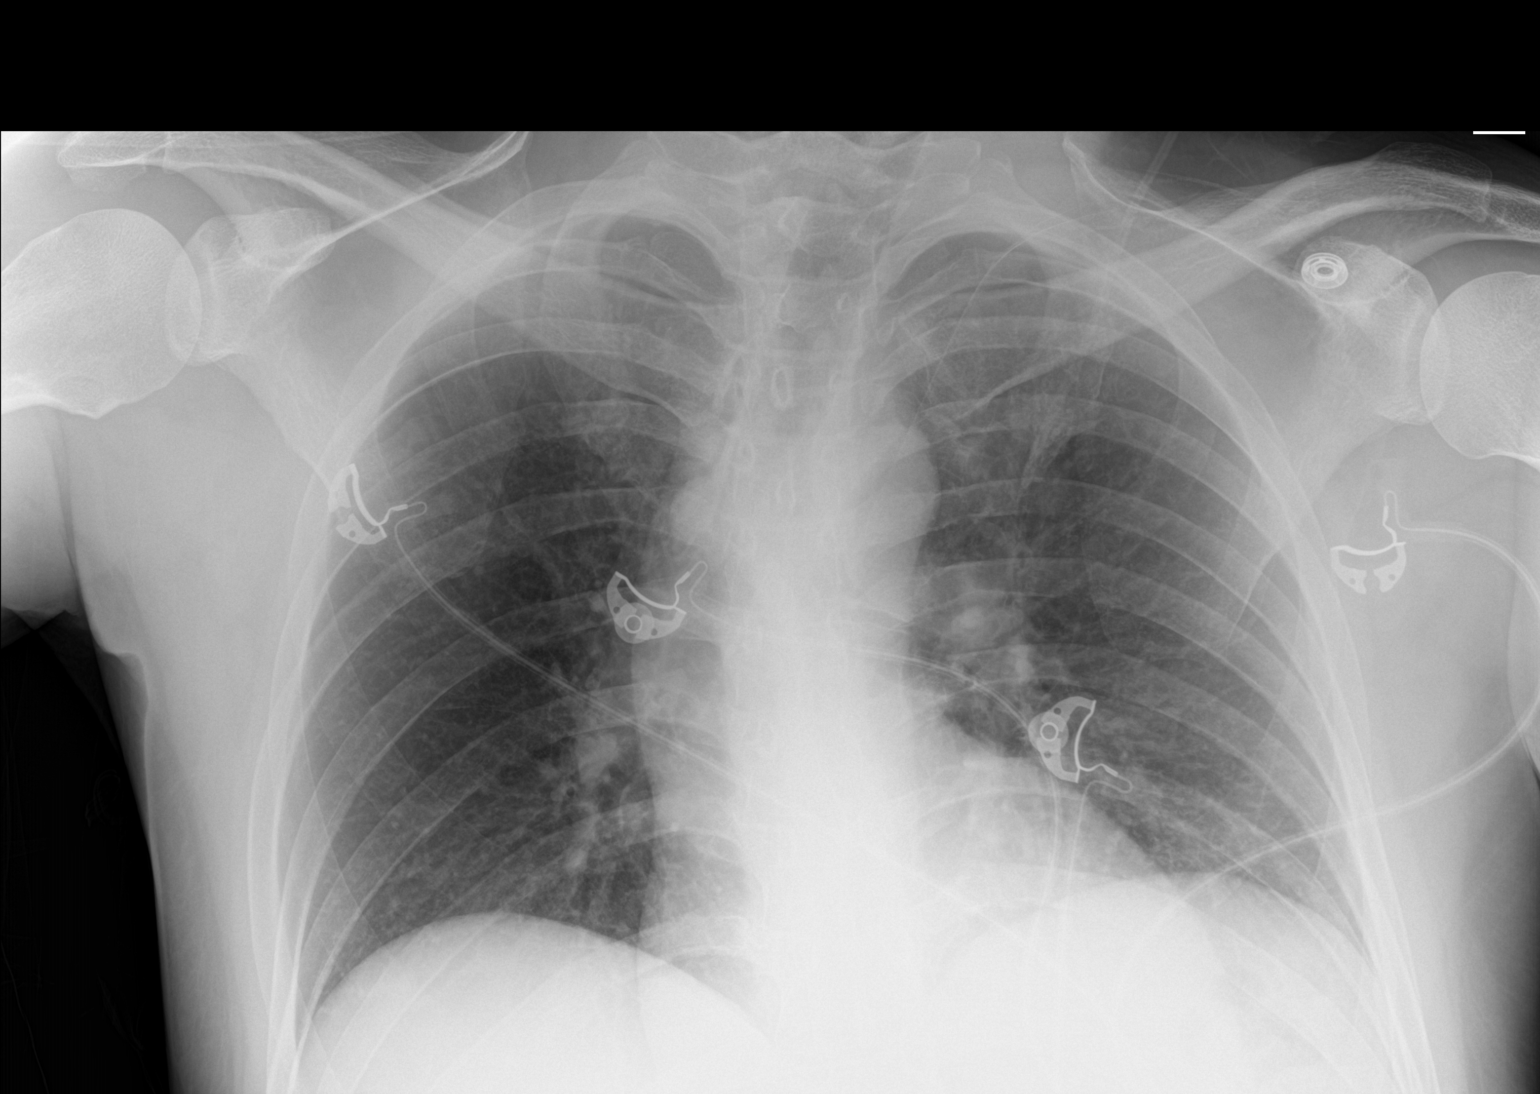

[1 of 1 positions shown; findings below may reference images not displayed]

FINDINGS: The heart size and mediastinal contours are within normal limits.
Both lungs are clear. No evidence of pneumothorax or pleural
effusion. The visualized skeletal structures are unremarkable.
IMPRESSION: No active disease.

## 2019-12-02 IMAGING — CT CT HEAD WITHOUT CONTRAST
3 series · 15 of 47 positions shown, 18 images · non-contrast
Comparison: None.

CLINICAL DATA: Found unresponsive. Unexplained altered level
consciousness.

EXAM:
CT HEAD WITHOUT CONTRAST
TECHNIQUE: Contiguous axial images were obtained from the base of the skull
through the vertex without intravenous contrast.

[Series 2: head wo · axial · 0.47mm/px · z∈[-56,+74]mm · 9 of 32 slices shown, 12 images]
[im 3/32  brain]
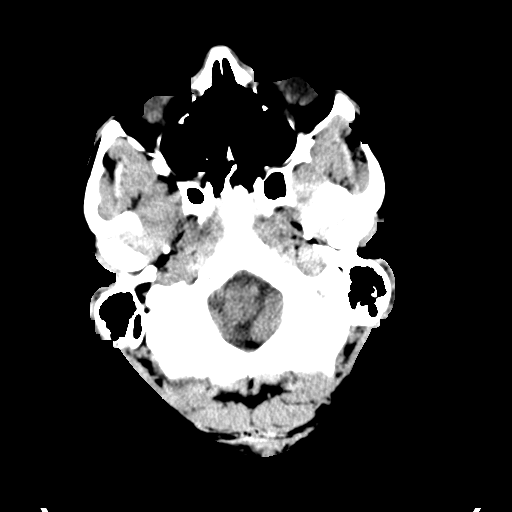
[im 3/32  bone]
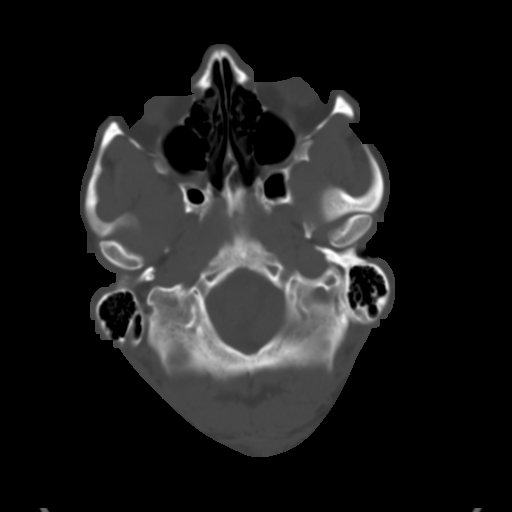
[im 6/32  brain]
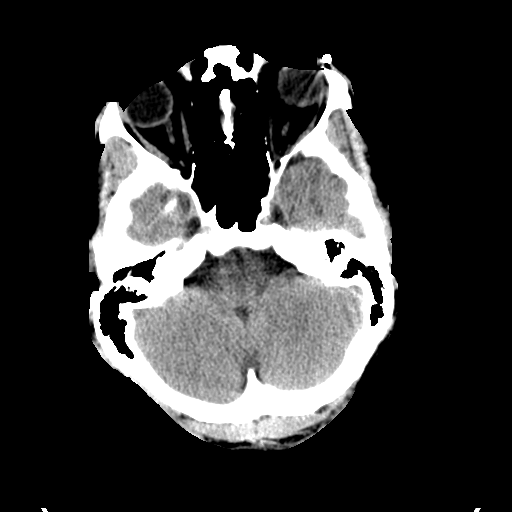
[im 9/32  brain]
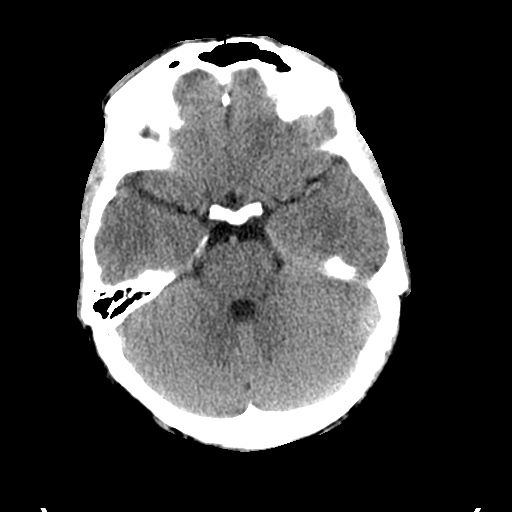
[im 12/32  brain]
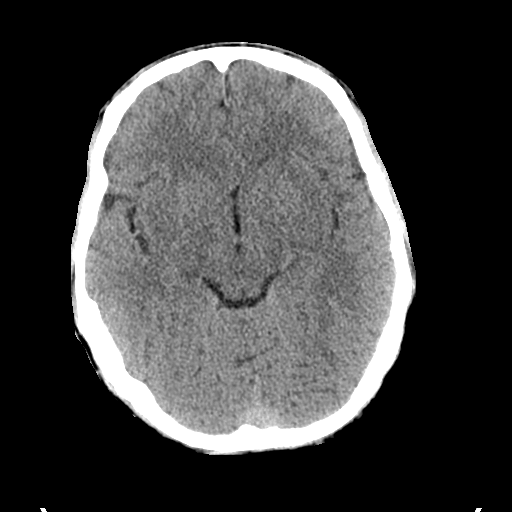
[im 17/32  brain]
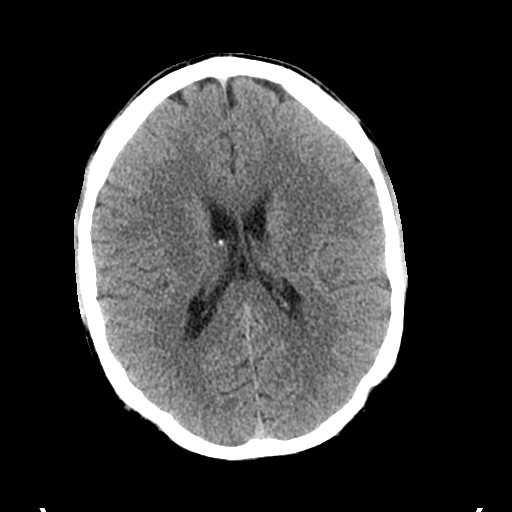
[im 17/32  bone]
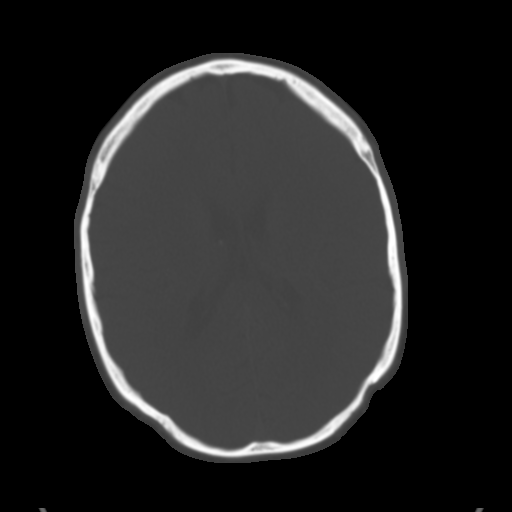
[im 20/32  brain]
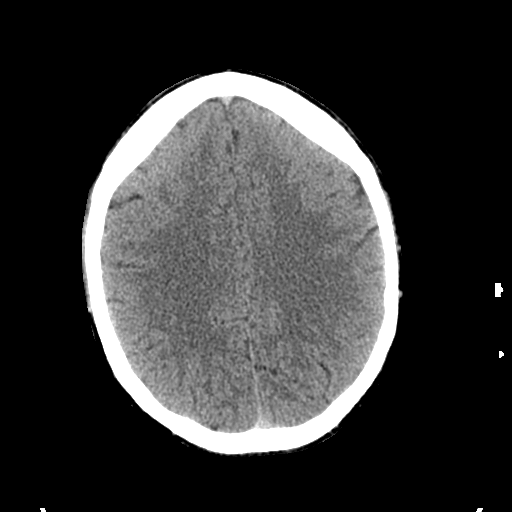
[im 23/32  brain]
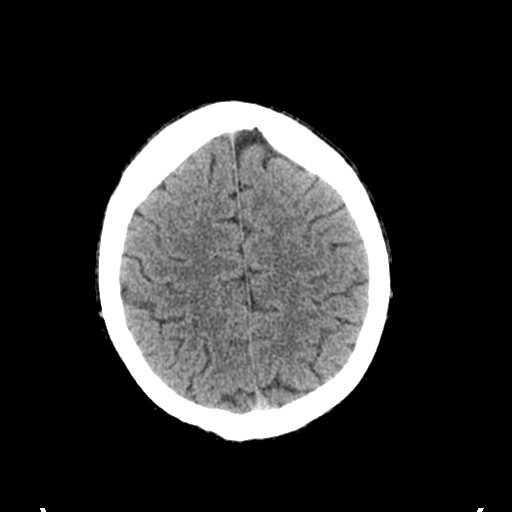
[im 26/32  brain]
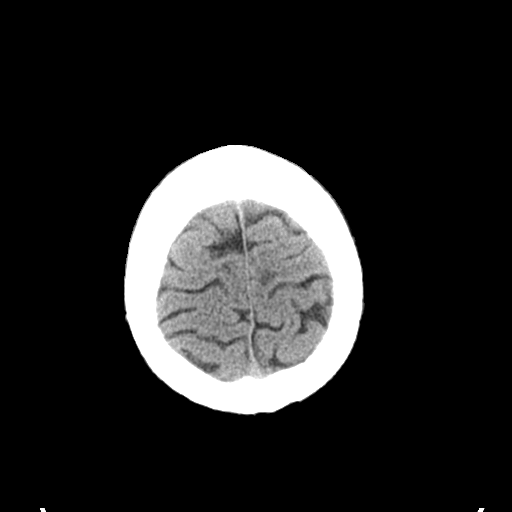
[im 29/32  brain]
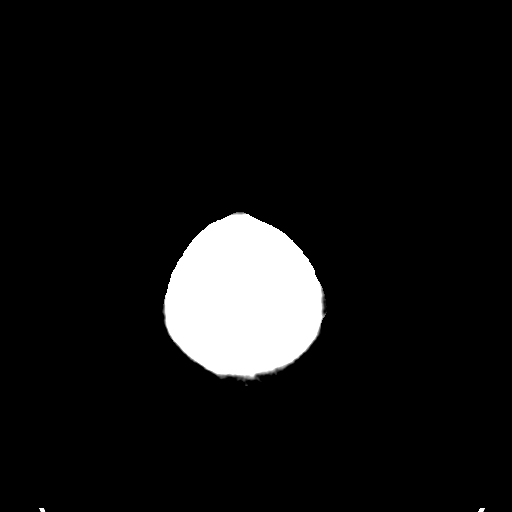
[im 29/32  bone]
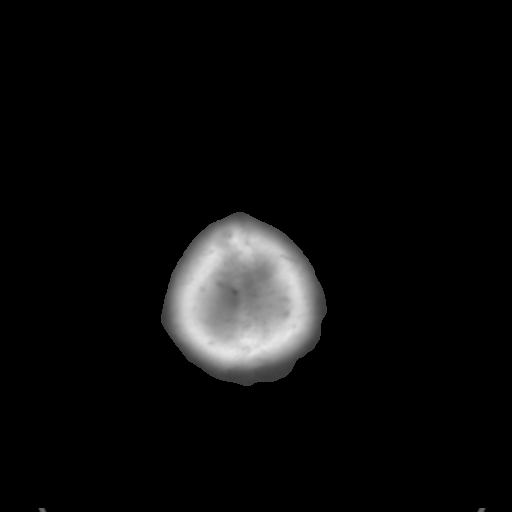

[Series 5: coronal soft tissue · coronal · 0.30mm/px · 3 of 74 slices shown]
[im 25/74  brain]
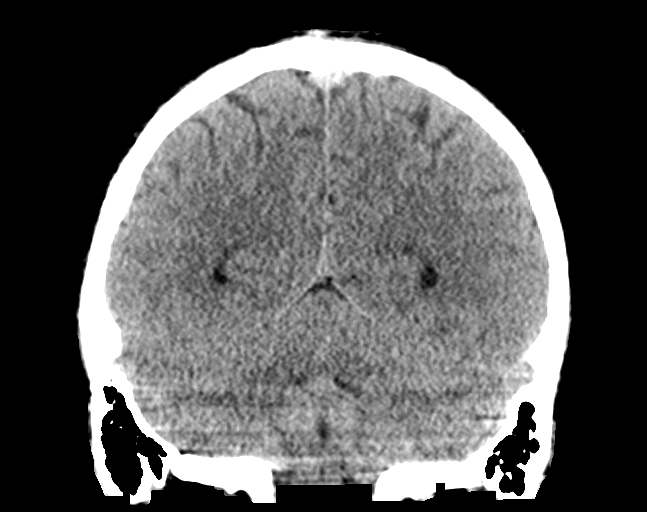
[im 33/74  brain]
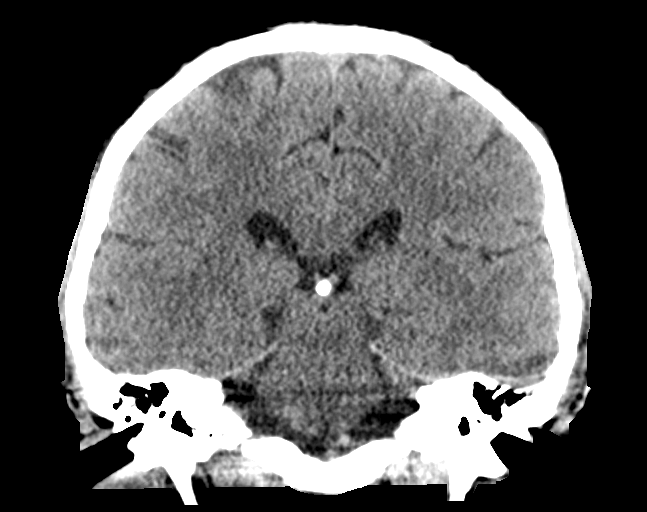
[im 41/74  brain]
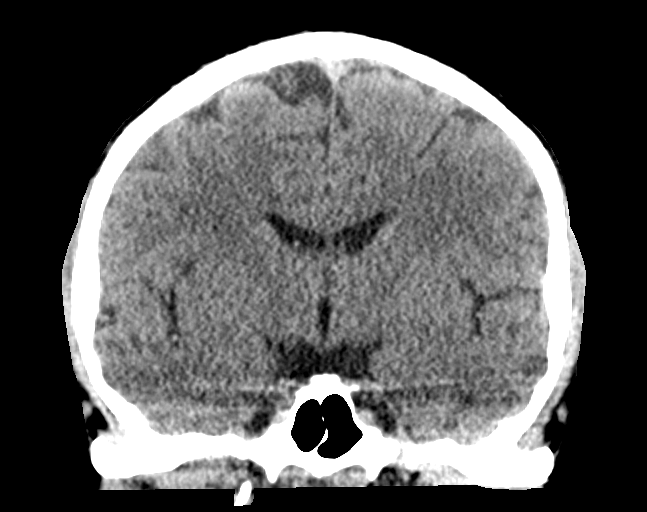

[Series 6: sagittal soft tissue · sagittal · 0.32mm/px · 3 of 62 slices shown]
[im 21/62  brain]
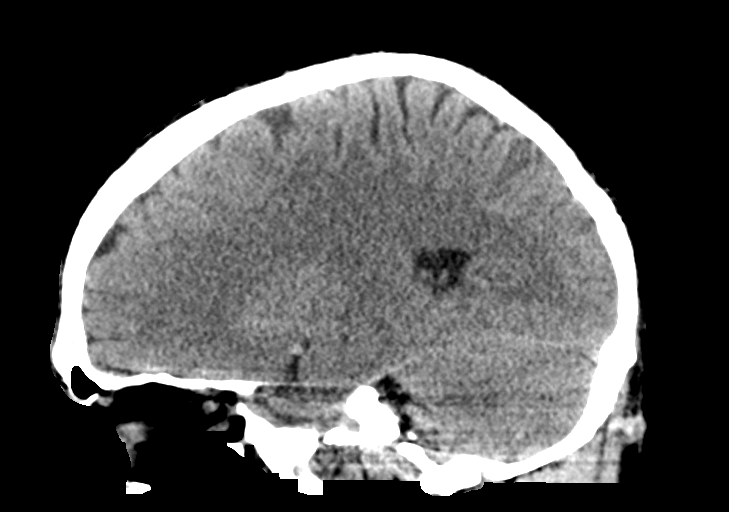
[im 31/62  brain]
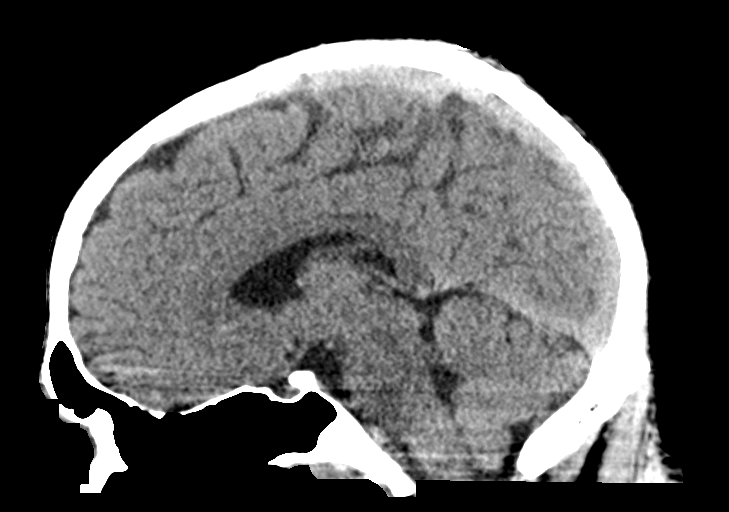
[im 41/62  brain]
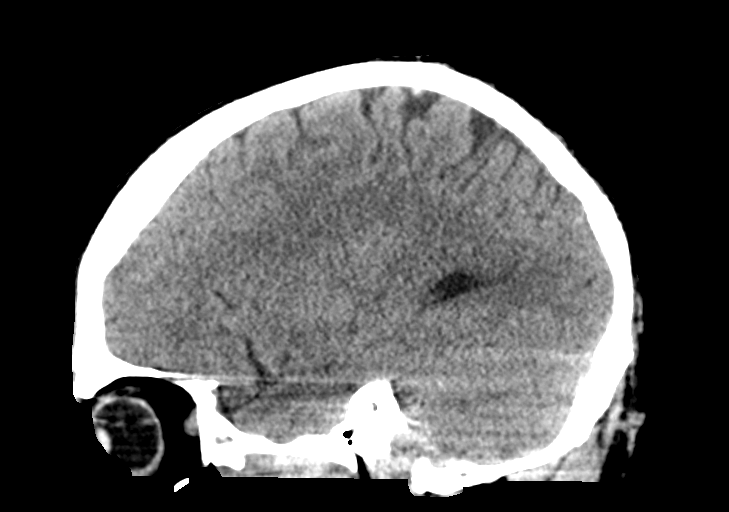

[15 of 47 positions shown; findings below may reference images not displayed]

FINDINGS: Brain: No evidence of acute infarction, hemorrhage, hydrocephalus,
extra-axial collection, or mass lesion/mass effect.

Vascular:  No hyperdense vessel or other acute findings.

Skull: No evidence of fracture or other significant bone
abnormality.

Sinuses/Orbits:  No acute findings.

Other: Hematoma seen in subcutaneous tissues in suboccipital region.
IMPRESSION: 1. No intracranial abnormality identified.
2. Hematoma in suboccipital subcutaneous tissues. No evidence of
skull fracture.

## 2019-12-18 ENCOUNTER — Other Ambulatory Visit: Payer: Self-pay | Admitting: Family

## 2019-12-18 DIAGNOSIS — B20 Human immunodeficiency virus [HIV] disease: Secondary | ICD-10-CM

## 2019-12-18 NOTE — Telephone Encounter (Signed)
Needs office visit.

## 2020-01-11 ENCOUNTER — Other Ambulatory Visit: Payer: Self-pay | Admitting: Family

## 2020-01-12 ENCOUNTER — Other Ambulatory Visit: Payer: Self-pay | Admitting: Family

## 2020-01-12 DIAGNOSIS — B20 Human immunodeficiency virus [HIV] disease: Secondary | ICD-10-CM

## 2020-01-15 ENCOUNTER — Telehealth: Payer: Self-pay

## 2020-01-15 NOTE — Telephone Encounter (Signed)
Attempted to call patient to schedule overdue appointment. Patient canceled last appointment and never rescheduled. Left voicemail requesting he call office back.

## 2020-01-23 ENCOUNTER — Other Ambulatory Visit: Payer: Self-pay | Admitting: Family

## 2020-02-12 DIAGNOSIS — B20 Human immunodeficiency virus [HIV] disease: Secondary | ICD-10-CM

## 2020-02-14 MED ORDER — BIKTARVY 50-200-25 MG PO TABS: 1.0000 | ORAL_TABLET | Freq: Every day | ORAL | 0 refills | Status: DC

## 2020-03-21 ENCOUNTER — Telehealth: Payer: Self-pay | Admitting: *Deleted

## 2020-03-21 ENCOUNTER — Other Ambulatory Visit: Payer: Self-pay | Admitting: Family

## 2020-03-21 DIAGNOSIS — B20 Human immunodeficiency virus [HIV] disease: Secondary | ICD-10-CM

## 2020-03-21 NOTE — Telephone Encounter (Signed)
Received refill request for Biktarvy. Per chart, last labs 10/20, last visit 11/20 with follow up supposed to be scheduled in 3 months.  He reached out via Mychart, asking for refill last month to bridge him while he finds care locally in [place].  He states today he does not know where to start to find a new doctor. He has [employer] now, but states it does not cover his medication. He still uses [facility] for medication. RN advised patient to contact his insurance company today to find a list of ID providers in his network.  He can also look online for ID physicians in his area.  Once he selects a doctor, RN asked him to send [place] a signed release of information request so we can forward his records to the new provider. Please advise on Biktarvy refill. Andree Coss, RN

## 2020-03-22 MED ORDER — BIKTARVY 50-200-25 MG PO TABS: 1.0000 | ORAL_TABLET | Freq: Every day | ORAL | 0 refills | Status: AC

## 2020-03-22 NOTE — Telephone Encounter (Signed)
Ok for 30 day supply will need office visit and lab work for additional refills.

## 2020-03-22 NOTE — Addendum Note (Signed)
Addended by: Andree Coss on: 03/22/2020 11:52 AM   Modules accepted: Orders

## 2020-03-22 NOTE — Telephone Encounter (Signed)
Spoke with patient. He will be following up with [facility].   Sent 30 day supply to [facility] of his choice. Andree Coss, RN

## 2020-04-17 ENCOUNTER — Other Ambulatory Visit: Payer: Self-pay | Admitting: Family

## 2020-04-17 DIAGNOSIS — B20 Human immunodeficiency virus [HIV] disease: Secondary | ICD-10-CM

## 2023-05-09 ENCOUNTER — Other Ambulatory Visit: Payer: Self-pay

## 2023-05-09 ENCOUNTER — Emergency Department (HOSPITAL_COMMUNITY)
Admission: EM | Admit: 2023-05-09 | Discharge: 2023-05-09 | Disposition: A | Discharge status: Home / Self Care | Payer: 59 | Attending: Emergency Medicine | Admitting: Emergency Medicine

## 2023-05-09 ENCOUNTER — Encounter (HOSPITAL_COMMUNITY): Payer: Self-pay

## 2023-05-09 ENCOUNTER — Emergency Department (HOSPITAL_COMMUNITY): Payer: 59

## 2023-05-09 DIAGNOSIS — R0789 Other chest pain: Secondary | ICD-10-CM | POA: Diagnosis not present

## 2023-05-09 DIAGNOSIS — R000 Tachycardia, unspecified: Secondary | ICD-10-CM | POA: Insufficient documentation

## 2023-05-09 DIAGNOSIS — M7989 Other specified soft tissue disorders: Secondary | ICD-10-CM | POA: Diagnosis present

## 2023-05-09 DIAGNOSIS — R600 Localized edema: Secondary | ICD-10-CM | POA: Diagnosis not present

## 2023-05-09 DIAGNOSIS — Z21 Asymptomatic human immunodeficiency virus [HIV] infection status: Secondary | ICD-10-CM | POA: Diagnosis not present

## 2023-05-09 DIAGNOSIS — Z7982 Long term (current) use of aspirin: Secondary | ICD-10-CM | POA: Diagnosis not present

## 2023-05-09 LAB — CBC WITH DIFFERENTIAL/PLATELET
Abs Immature Granulocytes: 0.01 10*3/uL (ref 0.00–0.07)
Basophils Absolute: 0 10*3/uL (ref 0.0–0.1)
Basophils Relative: 0 %
Eosinophils Absolute: 0.2 10*3/uL (ref 0.0–0.5)
Eosinophils Relative: 2 %
HCT: 42.9 % (ref 39.0–52.0)
Hemoglobin: 14.6 g/dL (ref 13.0–17.0)
Immature Granulocytes: 0 %
Lymphocytes Relative: 46 %
Lymphs Abs: 3.3 10*3/uL (ref 0.7–4.0)
MCH: 32.8 pg (ref 26.0–34.0)
MCHC: 34 g/dL (ref 30.0–36.0)
MCV: 96.4 fL (ref 80.0–100.0)
Monocytes Absolute: 0.6 10*3/uL (ref 0.1–1.0)
Monocytes Relative: 9 %
Neutro Abs: 3.1 10*3/uL (ref 1.7–7.7)
Neutrophils Relative %: 43 %
Platelets: 217 10*3/uL (ref 150–400)
RBC: 4.45 MIL/uL (ref 4.22–5.81)
RDW: 13.7 % (ref 11.5–15.5)
WBC: 7.2 10*3/uL (ref 4.0–10.5)
nRBC: 0 % (ref 0.0–0.2)

## 2023-05-09 LAB — BASIC METABOLIC PANEL
Anion gap: 7 (ref 5–15)
BUN: 10 mg/dL (ref 6–20)
CO2: 21 mmol/L — ABNORMAL LOW (ref 22–32)
Calcium: 8.7 mg/dL — ABNORMAL LOW (ref 8.9–10.3)
Chloride: 107 mmol/L (ref 98–111)
Creatinine, Ser: 0.88 mg/dL (ref 0.61–1.24)
GFR, Estimated: >60 mL/min (ref >60)
Glucose, Bld: 94 mg/dL (ref 70–99)
Potassium: 4.1 mmol/L (ref 3.5–5.1)
Sodium: 135 mmol/L (ref 135–145)

## 2023-05-09 LAB — TROPONIN I (HIGH SENSITIVITY): Troponin I (High Sensitivity): <2 ng/L (ref <18)

## 2023-05-09 LAB — BRAIN NATRIURETIC PEPTIDE: B Natriuretic Peptide: 11 pg/mL (ref 0.0–100.0)

## 2023-05-09 LAB — D-DIMER, QUANTITATIVE: D-Dimer, Quant: 0.3 ug{FEU}/mL (ref 0.00–0.50)

## 2023-05-09 NOTE — ED Provider Notes (Signed)
[place] EMERGENCY DEPARTMENT AT [facility] Inpatient Provider Note   CSN: [id number] Arrival date & time: 05/09/23  0045     History  Chief Complaint  Patient presents with   Leg Swelling    Shawn Proctor is a 42 y.o. male.  HPI     This is a 42 year old male with history of HIV who presents with multiple complaints.  Patient reports that he woke up yesterday morning and noted left lower extremity swelling.  This improved with elevation.  It has since resolved.  Distantly he states he has had right greater than left hand numbness.  It is bilateral in nature but definitely feels more normal on the right.  It is mostly in the fingers.  He reports palpitations and chest discomfort.  Not exertional in nature.  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  Denies fever or cough.  Home Medications Prior to Admission medications   Medication Sig Start Date End Date Taking? Authorizing Provider  aspirin EC 81 MG tablet Take 81 mg by mouth daily. Swallow whole.   Yes [provider]  bictegravir-emtricitabine-tenofovir AF (BIKTARVY) 50-200-25 MG TABS tablet Take 1 tablet by mouth daily. 03/22/20  Yes Veryl Speak, FNP  cefixime (SUPRAX) 400 MG CAPS capsule Take 400 mg by mouth daily. Patient not taking: Reported on 05/09/2023 05/01/23   [provider]  doxycycline (MONODOX) 100 MG capsule Take 100 mg by mouth 2 (two) times daily. Patient not taking: Reported on 05/09/2023 05/01/23   [provider]  valACYclovir (VALTREX) 1000 MG tablet TAKE 1 TABLET(1000 MG) BY MOUTH TWICE DAILY Patient not taking: Reported on 05/09/2023 01/11/20   Veryl Speak, FNP      Allergies    Clindamycin/lincomycin    Review of Systems   Review of Systems  Constitutional:  Negative for fever.  Respiratory:  Negative for shortness of breath.   Cardiovascular:  Positive for chest pain, palpitations and leg swelling.  Gastrointestinal:   Negative for abdominal pain, nausea and vomiting.  Neurological:  Positive for numbness.  All other systems reviewed and are negative.   Physical Exam Updated Vital Signs BP (!) 153/103 (BP Location: Left Arm)   Pulse 93   Temp 98.2 F (36.8 C) (Oral)   Resp (!) 22   Ht 1.88 m (6\' 2" ) Comment: Simultaneous filing. User may not have seen previous data.  Wt 95.3 kg Comment: Simultaneous filing. User may not have seen previous data.  SpO2 100%   BMI 26.96 kg/m  Physical Exam Vitals and nursing note reviewed.  Constitutional:      Appearance: He is well-developed. He is not ill-appearing.     Comments: Pressured speech noted  HENT:     Head: Normocephalic and atraumatic.     Mouth/Throat:     Mouth: Mucous membranes are moist.  Eyes:     Pupils: Pupils are equal, round, and reactive to light.  Cardiovascular:     Rate and Rhythm: Regular rhythm. Tachycardia present.     Heart sounds: Normal heart sounds. No murmur heard. Pulmonary:     Effort: Pulmonary effort is normal. No respiratory distress.     Breath sounds: Normal breath sounds. No wheezing.  Abdominal:     Palpations: Abdomen is soft.     Tenderness: There is no abdominal tenderness. There is no rebound.  Musculoskeletal:     Cervical back: Neck supple.     Right lower leg: No edema.  Left lower leg: No edema.  Lymphadenopathy:     Cervical: No cervical adenopathy.  Skin:    General: Skin is warm and dry.     Comments: Track marks noted bilateral upper extremities  Neurological:     Mental Status: He is alert and oriented to person, place, and time.  Psychiatric:     Comments: Pressured speech     ED Results / Procedures / Treatments   Labs (all labs ordered are listed, but only abnormal results are displayed) Labs Reviewed  BASIC METABOLIC PANEL - Abnormal; Notable for the following components:      Result Value   CO2 21 (*)    Calcium 8.7 (*)    All other components within normal limits  CBC WITH  DIFFERENTIAL/PLATELET  BRAIN NATRIURETIC PEPTIDE  D-DIMER, QUANTITATIVE  TROPONIN I (HIGH SENSITIVITY)    EKG EKG Interpretation Date/Time:  Sunday May 09 2023 00:53:38 EDT Ventricular Rate:  111 PR Interval:  138 QRS Duration:  97 QT Interval:  328 QTC Calculation: 446 R Axis:   65  Text Interpretation: Sinus tachycardia Confirmed by Ross Marcus ([id number]) on 05/09/2023 3:28:49 AM  Radiology DG Chest Portable 1 View  Result Date: 05/09/2023 CLINICAL DATA:  Palpitations and chest pain upon inspiration. EXAM: PORTABLE CHEST 1 VIEW COMPARISON:  February 11, 2019 FINDINGS: The heart size and mediastinal contours are within normal limits. Both lungs are clear. The visualized skeletal structures are unremarkable. IMPRESSION: No active disease. Electronically Signed   By: Aram Candela M.D.   On: 05/09/2023 02:45    Procedures Procedures    Medications Ordered in ED Medications - No data to display  ED Course/ Medical Decision Making/ A&P                                 Medical Decision Making Amount and/or Complexity of Data Reviewed Labs: ordered. Radiology: ordered.   This patient presents to the ED for concern of chest pain, numbness, lower extremity edema, this involves an extensive number of treatment options, and is a complaint that carries with it a high risk of complications and morbidity.  I considered the following differential and admission for this acute, potentially life threatening condition.  The differential diagnosis includes ACS, PE, pneumothorax, pneumonia, leg swelling  MDM:    This is a 42 year old male who presents with multiple complaints.  He is overall nontoxic.  Vital signs notable for mild tachycardia.  Reports last drug use several days ago but question whether he may be high.  He has track marks on his arms.  EKG shows no evidence of acute ischemia or arrhythmia.  Troponin is negative.  D-dimer is negative effectively ruling out PE.  Labs  obtained and reviewed including CBC, BMP, BNP.  These are all reassuring.  No significant metabolic derangements.  Chest x-ray without pneumothorax or pneumonia.  Overall reassuring workup.  Unclear etiology of the patient's symptoms.  However, overall does not appear to have an acute emergent process.  (Labs, imaging, consults)  Labs: I Ordered, and personally interpreted labs.  The pertinent results include: CBC, BMP, BNP, troponin, D-dimer  Imaging Studies ordered: I ordered imaging studies including chest x-ray I independently visualized and interpreted imaging. I agree with the radiologist interpretation  Additional history obtained from chart review.  External records from outside source obtained and reviewed including outside ER notes  Cardiac Monitoring: The patient was maintained on a cardiac monitor.  If on the cardiac monitor, I personally viewed and interpreted the cardiac monitored which showed an underlying rhythm of: Sinus rhythm  Reevaluation: After the interventions noted above, I reevaluated the patient and found that they have :improved  Social Determinants of Health:  lives independently  Disposition: Discharge  Co morbidities that complicate the patient evaluation  Past Medical History:  Diagnosis Date   HIV infection (HCC)      Medicines No orders of the defined types were placed in this encounter.   I have reviewed the patients home medicines and have made adjustments as needed  Problem List / ED Course: Problem List Items Addressed This Visit   None Visit Diagnoses     Leg edema    -  Primary   Atypical chest pain                       Final Clinical Impression(s) / ED Diagnoses Final diagnoses:  Leg edema  Atypical chest pain    Rx / DC Orders ED Discharge Orders     None         Shon Baton, MD 05/09/23 [phone]

## 2023-05-09 NOTE — ED Triage Notes (Signed)
Arrives with several complaints.   1) Left lower extremity swelling first noticed after waking up yesterday morning.   2) Right hand numbness beginning yesterday.   3) Heart racing sensation and pain with inspiration.   Rapid speech. Says last use of meth yesterday.

## 2023-05-09 NOTE — Discharge Instructions (Signed)
You were seen today for leg swelling and concern for chest discomfort and paresthesias.  Your workup today is reassuring including metabolic panels.

## 2024-01-09 ENCOUNTER — Inpatient Hospital Stay (HOSPITAL_COMMUNITY)
Admission: EM | Admit: 2024-01-09 | Discharge: 2024-01-13 | DRG: 494 | Disposition: A | Discharge status: Home / Self Care | Attending: Student | Admitting: Student

## 2024-01-09 ENCOUNTER — Emergency Department (HOSPITAL_COMMUNITY)

## 2024-01-09 ENCOUNTER — Encounter (HOSPITAL_COMMUNITY): Payer: Self-pay

## 2024-01-09 ENCOUNTER — Other Ambulatory Visit: Payer: Self-pay

## 2024-01-09 DIAGNOSIS — S82209A Unspecified fracture of shaft of unspecified tibia, initial encounter for closed fracture: Secondary | ICD-10-CM | POA: Diagnosis present

## 2024-01-09 DIAGNOSIS — Z560 Unemployment, unspecified: Secondary | ICD-10-CM | POA: Diagnosis not present

## 2024-01-09 DIAGNOSIS — Y92003 Bedroom of unspecified non-institutional (private) residence as the place of occurrence of the external cause: Secondary | ICD-10-CM | POA: Diagnosis not present

## 2024-01-09 DIAGNOSIS — S82291A Other fracture of shaft of right tibia, initial encounter for closed fracture: Principal | ICD-10-CM | POA: Diagnosis present

## 2024-01-09 DIAGNOSIS — Z881 Allergy status to other antibiotic agents status: Secondary | ICD-10-CM

## 2024-01-09 DIAGNOSIS — W010XXA Fall on same level from slipping, tripping and stumbling without subsequent striking against object, initial encounter: Secondary | ICD-10-CM | POA: Diagnosis present

## 2024-01-09 DIAGNOSIS — S82201A Unspecified fracture of shaft of right tibia, initial encounter for closed fracture: Secondary | ICD-10-CM | POA: Diagnosis not present

## 2024-01-09 DIAGNOSIS — Z21 Asymptomatic human immunodeficiency virus [HIV] infection status: Secondary | ICD-10-CM | POA: Diagnosis present

## 2024-01-09 DIAGNOSIS — S82831A Other fracture of upper and lower end of right fibula, initial encounter for closed fracture: Secondary | ICD-10-CM | POA: Diagnosis present

## 2024-01-09 DIAGNOSIS — S82241A Displaced spiral fracture of shaft of right tibia, initial encounter for closed fracture: Principal | ICD-10-CM

## 2024-01-09 DIAGNOSIS — S82301A Unspecified fracture of lower end of right tibia, initial encounter for closed fracture: Secondary | ICD-10-CM | POA: Diagnosis present

## 2024-01-09 DIAGNOSIS — Z7982 Long term (current) use of aspirin: Secondary | ICD-10-CM | POA: Diagnosis not present

## 2024-01-09 LAB — CBC WITH DIFFERENTIAL/PLATELET
Abs Immature Granulocytes: 0.01 10*3/uL (ref 0.00–0.07)
Basophils Absolute: 0 10*3/uL (ref 0.0–0.1)
Basophils Relative: 0 %
Eosinophils Absolute: 0 10*3/uL (ref 0.0–0.5)
Eosinophils Relative: 0 %
HCT: 43.8 % (ref 39.0–52.0)
Hemoglobin: 14.8 g/dL (ref 13.0–17.0)
Immature Granulocytes: 0 %
Lymphocytes Relative: 23 %
Lymphs Abs: 2.3 10*3/uL (ref 0.7–4.0)
MCH: 32 pg (ref 26.0–34.0)
MCHC: 33.8 g/dL (ref 30.0–36.0)
MCV: 94.8 fL (ref 80.0–100.0)
Monocytes Absolute: 0.9 10*3/uL (ref 0.1–1.0)
Monocytes Relative: 9 %
Neutro Abs: 6.7 10*3/uL (ref 1.7–7.7)
Neutrophils Relative %: 68 %
Platelets: 202 10*3/uL (ref 150–400)
RBC: 4.62 MIL/uL (ref 4.22–5.81)
RDW: 13.4 % (ref 11.5–15.5)
WBC: 9.9 10*3/uL (ref 4.0–10.5)
nRBC: 0 % (ref 0.0–0.2)

## 2024-01-09 LAB — BASIC METABOLIC PANEL WITH GFR
Anion gap: 13 (ref 5–15)
BUN: 16 mg/dL (ref 6–20)
CO2: 22 mmol/L (ref 22–32)
Calcium: 9 mg/dL (ref 8.9–10.3)
Chloride: 106 mmol/L (ref 98–111)
Creatinine, Ser: 0.93 mg/dL (ref 0.61–1.24)
GFR, Estimated: >60 mL/min (ref >60)
Glucose, Bld: 90 mg/dL (ref 70–99)
Potassium: 3.8 mmol/L (ref 3.5–5.1)
Sodium: 141 mmol/L (ref 135–145)

## 2024-01-09 MED ORDER — PROPOFOL 10 MG/ML IV BOLUS
INTRAVENOUS | Status: AC
  Filled 2024-01-09: qty 20

## 2024-01-09 MED ORDER — HYDROMORPHONE HCL 1 MG/ML IJ SOLN
1.0000 mg | Freq: Once | INTRAMUSCULAR | Status: AC
  Administered 2024-01-09: 1 mg via INTRAVENOUS
  Filled 2024-01-09: qty 1

## 2024-01-09 MED ORDER — KETAMINE HCL 10 MG/ML IJ SOLN
INTRAMUSCULAR | Status: AC | PRN
Start: 2024-01-09 — End: 2024-01-09
  Administered 2024-01-09: 100 mg via INTRAVENOUS

## 2024-01-09 MED ORDER — PROPOFOL 10 MG/ML IV BOLUS
INTRAVENOUS | Status: AC | PRN
  Administered 2024-01-09: 60 mg via INTRAVENOUS
  Administered 2024-01-09: 40 mg via INTRAVENOUS
  Administered 2024-01-09: 60 mg via INTRAVENOUS

## 2024-01-09 MED ORDER — HYDROMORPHONE HCL 1 MG/ML IJ SOLN
1.0000 mg | INTRAMUSCULAR | Status: DC | PRN
  Administered 2024-01-09: 1 mg via INTRAVENOUS
  Filled 2024-01-09: qty 1

## 2024-01-09 MED ORDER — KETAMINE HCL 50 MG/5ML IJ SOSY
1.0000 mg/kg | PREFILLED_SYRINGE | Freq: Once | INTRAMUSCULAR | Status: AC
  Administered 2024-01-09: 100 mg via INTRAVENOUS
  Filled 2024-01-09: qty 10

## 2024-01-09 NOTE — ED Triage Notes (Signed)
 Patient tripped over something in his bedroom at 10am today and rolled his right ankle. Crying in pain on arrival.

## 2024-01-09 NOTE — Progress Notes (Signed)
 Patient came in with tibial fracture.  Closed reduction performed.  EtCO2 used during procedure; patient tolerated procedure well.  CO2 maintained within normal parameters during procedure.

## 2024-01-09 NOTE — ED Provider Notes (Signed)
 [place] [facility] AT [facility] Provider Note  CSN: [id number] Arrival date & time: 01/09/24 4098  Chief Complaint(s) Ankle Injury  HPI Shawn Proctor is a 43 y.o. male here today with right pain in his lower leg.  Patient reports that he was walking today, tripped over something in his friend's bedroom at 10 AM this morning rolled his right ankle.  He was hopeful that it would get better, however his pain worsened so he came to the emergency room.   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 Authorizing Provider  aspirin EC 81 MG tablet Take 81 mg by mouth daily. Swallow whole.    [provider]  bictegravir-emtricitabine-tenofovir AF (BIKTARVY ) 50-200-25 MG TABS tablet Take 1 tablet by mouth daily. 03/22/20   Calone, Gregory D, FNP  cefixime (SUPRAX) 400 MG CAPS capsule Take 400 mg by mouth daily. Patient not taking: Reported on 05/09/2023 05/01/23   [provider]  doxycycline (MONODOX) 100 MG capsule Take 100 mg by mouth 2 (two) times daily. Patient not taking: Reported on 05/09/2023 05/01/23   [provider]  valACYclovir  (VALTREX ) 1000 MG tablet TAKE 1 TABLET(1000 MG) BY MOUTH TWICE DAILY Patient not taking: Reported on 05/09/2023 01/11/20   Bubba Carbo, FNP                                                                                                                                    Past Surgical History History reviewed. No pertinent surgical history. Family History History reviewed. No pertinent family history.  Social History Social  History   Tobacco Use   Smoking status: Never   Smokeless tobacco: Never  Vaping Use   Vaping status: Never Used  Substance Use Topics   Alcohol use: Not Currently   Drug use: Not Currently   Allergies Clindamycin/lincomycin  Review of Systems Review of Systems  Physical Exam Vital Signs  I have reviewed the triage vital signs BP (!) 166/105   Pulse (!) 116   Temp 98.9 F (37.2 C) (Oral)   Resp 15   Ht 6\' 2"  (1.88 m)   Wt 96 kg   SpO2 97%   BMI 27.17 kg/m   Physical Exam Vitals reviewed.  Cardiovascular:     Rate and Rhythm: Normal rate.     Pulses: Normal pulses.  Pulmonary:     Effort: Pulmonary effort is normal.  Abdominal:     General: Abdomen is flat.  Musculoskeletal:     Comments: Deformity of the right lower leg.  Soft compartments.  Good pulses.  Patient able to wiggle toes.  Skin:    General: Skin is warm.  Neurological:     General: No focal deficit present.     Mental Status: He is alert.     Comments: Good pulses.  Patient able to wiggle toes.     ED Results and Treatments Labs (all labs ordered are listed, but only abnormal results are displayed) Labs Reviewed  BASIC METABOLIC PANEL WITH GFR  CBC WITH DIFFERENTIAL/PLATELET                                                                                                                          Radiology No results found.  Pertinent labs & imaging results that were available during my care of the patient were reviewed by me and considered in my medical decision making (see MDM for details).  Medications Ordered in ED Medications  HYDROmorphone (DILAUDID) injection 1 mg (has no administration in time range)                                                                                                                                     Procedures .Reduction of fracture  Date/Time: 01/09/2024 9:07 PM  Performed by: Nathanael Baker, DO Authorized by: Nathanael Baker, DO  Consent:  Written consent obtained. Consent given by: patient Patient understanding: patient states understanding of the procedure being performed Patient consent: the patient's understanding of the procedure matches consent given Procedure consent: procedure consent matches procedure scheduled Relevant documents: relevant documents present and verified Test results: test results available and properly labeled Site marked: the operative site was marked Imaging studies: imaging studies available Patient identity confirmed: verbally with patient Local anesthesia used: no  Anesthesia: Local anesthesia used: no  Sedation: Patient sedated: yes Sedatives: ketamine and propofol Vitals: Vital signs were monitored during sedation.  Patient tolerance: patient tolerated the procedure well with no immediate complications   .Sedation  Date/Time: 01/09/2024 9:10 PM  Performed by: Nathanael Baker, DO Authorized by: Nathanael Baker, DO   Consent:    Consent obtained:  Verbal   Consent given by:  Patient   Risks discussed:  Allergic reaction, prolonged hypoxia resulting in organ damage, prolonged sedation necessitating reversal, dysrhythmia, inadequate sedation, respiratory compromise necessitating ventilatory assistance and intubation, vomiting and nausea   Alternatives discussed:  Analgesia without sedation Universal protocol:  Immediately prior to procedure, a time out was called: yes   Indications:    Procedure performed:  Fracture reduction Pre-sedation assessment:    Time since last food or drink:  4   ASA classification: class 1 - normal, healthy patient     Mouth opening:  3 or more finger widths   Thyromental distance:  4 finger widths   Mallampati score:  I - soft palate, uvula, fauces, pillars visible   Neck mobility: normal     Pre-sedation assessments completed and reviewed: airway patency, cardiovascular function, hydration status, mental status, nausea/vomiting, pain level,  respiratory function and temperature   A pre-sedation assessment was completed prior to the start of the procedure Immediate pre-procedure details:    Reassessment: Patient reassessed immediately prior to procedure   Procedure details (see MAR for exact dosages):    Sedation:  Propofol and ketamine   Intended level of sedation: moderate (conscious sedation)   Analgesia:  Hydromorphone   Intra-procedure monitoring:  Continuous capnometry, continuous pulse oximetry, blood pressure monitoring and cardiac monitor   Intra-procedure events: none     Total Provider sedation time (minutes):  25 Post-procedure details:   A post-sedation assessment was completed following the completion of the procedure.   Patient is stable for discharge or admission: yes     Procedure completion:  Tolerated well, no immediate complications Comments:     Patient required 10 mg of ketamine, 160 mg of propofol.   (including critical care time)  Medical Decision Making / ED Course   This patient presents to the ED for concern of lower leg pain, this involves an extensive number of treatment options, and is a complaint that carries with it a high risk of complications and morbidity.  The differential diagnosis includes fracture, sprain.  MDM: Patient with what appears to be spiral fracture on plain films.  He has soft compartments.  Patient quite uncomfortable.  Not sure why the patient wait until 6 PM to come to the emergency room if he broke this at 10 AM.  Have provided analgesia.  Will reach out to orthopedic surgery.  Reassessment 9 PM-spoke with Dr. Hiram Lukes who reviewed the patient's images.  He is requesting CT imaging of the lower extremity.  He will admit the [facility] to [facility].  Performed a sedation and reduction on the patient.  Reduction somewhat complicated by sedation as patient required quite high doses of both ketamine and propofol.  Was able to achieve modest improvement in fracture.   Additional  history obtained: -Additional history obtained from EMS -External records from outside source obtained and reviewed including: Chart review including previous notes, labs, imaging, consultation notes   Lab Tests: -I ordered, reviewed, and interpreted labs.   The pertinent results include:   Labs Reviewed  BASIC METABOLIC PANEL WITH GFR  CBC WITH DIFFERENTIAL/PLATELET      EKG my independent review of the patient's EKG shows no ST segment depressions or elevations, no T wave inversions, no evidence of acute ischemia.  EKG Interpretation Date/Time:    Ventricular Rate:    PR Interval:    QRS Duration:    QT Interval:    QTC Calculation:   R Axis:      Text Interpretation:           Imaging Studies ordered: I ordered imaging studies including lower extremity x-rays I independently visualized and interpreted imaging. I agree with the radiologist interpretation   Medicines ordered and prescription drug management: Meds ordered this  encounter  Medications   HYDROmorphone (DILAUDID) injection 1 mg    -I have reviewed the patients home medicines and have made adjustments as needed   Cardiac Monitoring: The patient was maintained on a cardiac monitor.  I personally viewed and interpreted the cardiac monitored which showed an underlying rhythm of: Normal sinus rhythm  Social Determinants of Health:  Factors impacting patients care include: Lack of access to primary care   Reevaluation: After the interventions noted above, I reevaluated the patient and found that they have :improved  Co morbidities that complicate the patient evaluation  Past Medical History:  Diagnosis Date   HIV infection (HCC)       Dispostion: Admitted to orthopedic service.     Final Clinical Impression(s) / ED Diagnoses Final diagnoses:  None     @PCDICTATION @    Afton Horse T, DO 01/09/24 2113

## 2024-01-09 NOTE — ED Notes (Signed)
 This RN spoke to [employer] for transportation to [facility].

## 2024-01-09 NOTE — Progress Notes (Signed)
 Pt dw EDP and Dr. Curtiss Dowdy of the ortho trauma service.  We will plan for OR tomorrow for IMN tibia with Dr. Curtiss Dowdy.  NPO tonight at [place].  Admit to ortho service tonight at [facility].  Reduced and splinted in ED by EDP. Strict elevation over night.

## 2024-01-10 ENCOUNTER — Inpatient Hospital Stay (HOSPITAL_COMMUNITY): Admitting: Registered Nurse

## 2024-01-10 ENCOUNTER — Other Ambulatory Visit: Payer: Self-pay

## 2024-01-10 ENCOUNTER — Inpatient Hospital Stay (HOSPITAL_COMMUNITY)

## 2024-01-10 ENCOUNTER — Encounter (HOSPITAL_COMMUNITY): Payer: Self-pay | Admitting: Orthopedic Surgery

## 2024-01-10 ENCOUNTER — Encounter (HOSPITAL_COMMUNITY)
Admission: EM | Disposition: A | Discharge status: Home / Self Care | Payer: Self-pay | Source: Home / Self Care | Attending: Student

## 2024-01-10 DIAGNOSIS — S82301A Unspecified fracture of lower end of right tibia, initial encounter for closed fracture: Secondary | ICD-10-CM | POA: Diagnosis present

## 2024-01-10 DIAGNOSIS — S82201A Unspecified fracture of shaft of right tibia, initial encounter for closed fracture: Secondary | ICD-10-CM

## 2024-01-10 HISTORY — PX: TIBIA IM NAIL INSERTION: SHX2516

## 2024-01-10 LAB — CREATININE, SERUM
Creatinine, Ser: 0.87 mg/dL (ref 0.61–1.24)
GFR, Estimated: >60 mL/min (ref >60)

## 2024-01-10 LAB — CBC
HCT: 41.7 % (ref 39.0–52.0)
Hemoglobin: 14.5 g/dL (ref 13.0–17.0)
MCH: 32.5 pg (ref 26.0–34.0)
MCHC: 34.8 g/dL (ref 30.0–36.0)
MCV: 93.5 fL (ref 80.0–100.0)
Platelets: 160 10*3/uL (ref 150–400)
RBC: 4.46 MIL/uL (ref 4.22–5.81)
RDW: 13.2 % (ref 11.5–15.5)
WBC: 6.4 10*3/uL (ref 4.0–10.5)
nRBC: 0 % (ref 0.0–0.2)

## 2024-01-10 LAB — SURGICAL PCR SCREEN
MRSA, PCR: POSITIVE — AB
Staphylococcus aureus: POSITIVE — AB

## 2024-01-10 LAB — VITAMIN D 25 HYDROXY (VIT D DEFICIENCY, FRACTURES): Vit D, 25-Hydroxy: 24.96 ng/mL — ABNORMAL LOW (ref 30–100)

## 2024-01-10 SURGERY — INSERTION, INTRAMEDULLARY ROD, TIBIA
Anesthesia: General | Laterality: Right

## 2024-01-10 MED ORDER — ACETAMINOPHEN 325 MG PO TABS: 325.0000 mg | ORAL_TABLET | Freq: Four times a day (QID) | ORAL | Status: DC | PRN

## 2024-01-10 MED ORDER — MIDAZOLAM HCL 2 MG/2ML IJ SOLN
INTRAMUSCULAR | Status: DC | PRN
  Administered 2024-01-10: 2 mg via INTRAVENOUS

## 2024-01-10 MED ORDER — TRANEXAMIC ACID-NACL 1000-0.7 MG/100ML-% IV SOLN
1000.0000 mg | INTRAVENOUS | Status: AC
  Administered 2024-01-10: 1000 mg via INTRAVENOUS

## 2024-01-10 MED ORDER — CEFAZOLIN SODIUM-DEXTROSE 2-4 GM/100ML-% IV SOLN
2.0000 g | INTRAVENOUS | Status: AC
  Administered 2024-01-10: 2 g via INTRAVENOUS

## 2024-01-10 MED ORDER — CHLORHEXIDINE GLUCONATE 4 % EX SOLN: 1.0000 | CUTANEOUS | 1 refills | Status: AC

## 2024-01-10 MED ORDER — ENOXAPARIN SODIUM 60 MG/0.6ML IJ SOSY
50.0000 mg | PREFILLED_SYRINGE | INTRAMUSCULAR | Status: DC
  Administered 2024-01-11 – 2024-01-13 (×3): 50 mg via SUBCUTANEOUS
  Filled 2024-01-10 (×3): qty 0.6

## 2024-01-10 MED ORDER — ACETAMINOPHEN 10 MG/ML IV SOLN
1000.0000 mg | Freq: Once | INTRAVENOUS | Status: DC | PRN
  Administered 2024-01-10: 1000 mg via INTRAVENOUS

## 2024-01-10 MED ORDER — CEFAZOLIN SODIUM-DEXTROSE 2-4 GM/100ML-% IV SOLN
2.0000 g | Freq: Three times a day (TID) | INTRAVENOUS | Status: AC
  Administered 2024-01-10 – 2024-01-11 (×3): 2 g via INTRAVENOUS
  Filled 2024-01-10 (×3): qty 100

## 2024-01-10 MED ORDER — HYDROMORPHONE HCL 1 MG/ML IJ SOLN
0.2500 mg | INTRAMUSCULAR | Status: DC | PRN
  Administered 2024-01-10 (×4): 0.5 mg via INTRAVENOUS

## 2024-01-10 MED ORDER — METHOCARBAMOL 1000 MG/10ML IJ SOLN
INTRAMUSCULAR | Status: AC
Start: 2024-01-10 — End: 2024-01-10
  Filled 2024-01-10: qty 10

## 2024-01-10 MED ORDER — ONDANSETRON HCL 4 MG PO TABS
4.0000 mg | ORAL_TABLET | Freq: Four times a day (QID) | ORAL | Status: DC | PRN
Start: 2024-01-10 — End: 2024-01-13

## 2024-01-10 MED ORDER — ORAL CARE MOUTH RINSE: 15.0000 mL | Freq: Once | OROMUCOSAL | Status: AC

## 2024-01-10 MED ORDER — CHLORHEXIDINE GLUCONATE 0.12 % MT SOLN: 15.0000 mL | Freq: Once | OROMUCOSAL | Status: AC

## 2024-01-10 MED ORDER — KETAMINE HCL 10 MG/ML IJ SOLN
INTRAMUSCULAR | Status: DC | PRN
  Administered 2024-01-10 (×2): 10 mg via INTRAVENOUS
  Administered 2024-01-10: 20 mg via INTRAVENOUS

## 2024-01-10 MED ORDER — PHENYLEPHRINE 80 MCG/ML (10ML) SYRINGE FOR IV PUSH (FOR BLOOD PRESSURE SUPPORT)
PREFILLED_SYRINGE | INTRAVENOUS | Status: DC | PRN
  Administered 2024-01-10 (×4): 80 ug via INTRAVENOUS

## 2024-01-10 MED ORDER — CHLORHEXIDINE GLUCONATE 4 % EX SOLN: 60.0000 mL | Freq: Once | CUTANEOUS | Status: DC

## 2024-01-10 MED ORDER — SODIUM CHLORIDE 0.9% FLUSH
3.0000 mL | Freq: Two times a day (BID) | INTRAVENOUS | Status: DC
  Administered 2024-01-10 – 2024-01-11 (×3): 10 mL via INTRAVENOUS
  Administered 2024-01-12: 3 mL via INTRAVENOUS
  Administered 2024-01-12 – 2024-01-13 (×2): 10 mL via INTRAVENOUS

## 2024-01-10 MED ORDER — KETOROLAC TROMETHAMINE 15 MG/ML IJ SOLN
15.0000 mg | Freq: Four times a day (QID) | INTRAMUSCULAR | Status: AC
  Administered 2024-01-10 – 2024-01-11 (×4): 15 mg via INTRAVENOUS
  Filled 2024-01-10 (×4): qty 1

## 2024-01-10 MED ORDER — VANCOMYCIN HCL 1000 MG IV SOLR
INTRAVENOUS | Status: DC | PRN
  Administered 2024-01-10: 1000 mg via TOPICAL

## 2024-01-10 MED ORDER — 0.9 % SODIUM CHLORIDE (POUR BTL) OPTIME
TOPICAL | Status: DC | PRN
  Administered 2024-01-10: 1000 mL

## 2024-01-10 MED ORDER — BICTEGRAVIR-EMTRICITAB-TENOFOV 50-200-25 MG PO TABS
1.0000 | ORAL_TABLET | Freq: Every day | ORAL | Status: DC
  Administered 2024-01-10 – 2024-01-13 (×4): 1 via ORAL
  Filled 2024-01-10 (×4): qty 1

## 2024-01-10 MED ORDER — POLYETHYLENE GLYCOL 3350 17 G PO PACK
17.0000 g | PACK | Freq: Every day | ORAL | Status: DC | PRN
  Administered 2024-01-12: 17 g via ORAL
  Filled 2024-01-10 (×2): qty 1

## 2024-01-10 MED ORDER — FENTANYL CITRATE (PF) 250 MCG/5ML IJ SOLN
INTRAMUSCULAR | Status: AC
  Filled 2024-01-10: qty 5

## 2024-01-10 MED ORDER — LIDOCAINE 2% (20 MG/ML) 5 ML SYRINGE
INTRAMUSCULAR | Status: DC | PRN
  Administered 2024-01-10: 100 mg via INTRAVENOUS

## 2024-01-10 MED ORDER — METOCLOPRAMIDE HCL 5 MG PO TABS: 5.0000 mg | ORAL_TABLET | Freq: Three times a day (TID) | ORAL | Status: DC | PRN

## 2024-01-10 MED ORDER — POVIDONE-IODINE 10 % EX SWAB: 2.0000 | Freq: Once | CUTANEOUS | Status: DC

## 2024-01-10 MED ORDER — CEFAZOLIN SODIUM-DEXTROSE 2-4 GM/100ML-% IV SOLN
INTRAVENOUS | Status: AC
  Filled 2024-01-10: qty 100

## 2024-01-10 MED ORDER — ONDANSETRON HCL 4 MG/2ML IJ SOLN: 4.0000 mg | Freq: Once | INTRAMUSCULAR | Status: DC | PRN

## 2024-01-10 MED ORDER — OXYCODONE HCL 5 MG/5ML PO SOLN: 5.0000 mg | Freq: Once | ORAL | Status: DC | PRN

## 2024-01-10 MED ORDER — FENTANYL CITRATE (PF) 250 MCG/5ML IJ SOLN
INTRAMUSCULAR | Status: DC | PRN
Start: 2024-01-10 — End: 2024-01-10
  Administered 2024-01-10 (×3): 50 ug via INTRAVENOUS

## 2024-01-10 MED ORDER — ONDANSETRON HCL 4 MG/2ML IJ SOLN: 4.0000 mg | Freq: Four times a day (QID) | INTRAMUSCULAR | Status: DC | PRN

## 2024-01-10 MED ORDER — SUGAMMADEX SODIUM 200 MG/2ML IV SOLN
INTRAVENOUS | Status: DC | PRN
Start: 2024-01-10 — End: 2024-01-10
  Administered 2024-01-10: 400 mg via INTRAVENOUS

## 2024-01-10 MED ORDER — METOCLOPRAMIDE HCL 5 MG/ML IJ SOLN: 5.0000 mg | Freq: Three times a day (TID) | INTRAMUSCULAR | Status: DC | PRN

## 2024-01-10 MED ORDER — METHOCARBAMOL 500 MG PO TABS
500.0000 mg | ORAL_TABLET | Freq: Four times a day (QID) | ORAL | Status: DC | PRN
  Administered 2024-01-11 – 2024-01-12 (×3): 500 mg via ORAL
  Filled 2024-01-10 (×3): qty 1

## 2024-01-10 MED ORDER — MUPIROCIN 2 % EX OINT
1.0000 | TOPICAL_OINTMENT | Freq: Two times a day (BID) | CUTANEOUS | Status: DC
  Administered 2024-01-10 – 2024-01-13 (×7): 1 via NASAL
  Filled 2024-01-10 (×3): qty 22

## 2024-01-10 MED ORDER — OXYCODONE HCL 5 MG PO TABS
5.0000 mg | ORAL_TABLET | ORAL | Status: DC | PRN
  Filled 2024-01-10 (×2): qty 2

## 2024-01-10 MED ORDER — METHOCARBAMOL 1000 MG/10ML IJ SOLN
500.0000 mg | Freq: Four times a day (QID) | INTRAMUSCULAR | Status: DC | PRN
  Administered 2024-01-10: 500 mg via INTRAVENOUS

## 2024-01-10 MED ORDER — SODIUM CHLORIDE 0.9% FLUSH: 3.0000 mL | INTRAVENOUS | Status: DC | PRN

## 2024-01-10 MED ORDER — BACITRACIN ZINC 500 UNIT/GM EX OINT
TOPICAL_OINTMENT | CUTANEOUS | Status: AC
  Filled 2024-01-10: qty 28.35

## 2024-01-10 MED ORDER — HYDROMORPHONE HCL 1 MG/ML IJ SOLN
INTRAMUSCULAR | Status: AC
  Filled 2024-01-10: qty 1

## 2024-01-10 MED ORDER — CHLORHEXIDINE GLUCONATE 0.12 % MT SOLN
OROMUCOSAL | Status: AC
  Administered 2024-01-10: 15 mL via OROMUCOSAL
  Filled 2024-01-10: qty 15

## 2024-01-10 MED ORDER — DEXAMETHASONE SODIUM PHOSPHATE 10 MG/ML IJ SOLN
INTRAMUSCULAR | Status: DC | PRN
  Administered 2024-01-10: 10 mg via INTRAVENOUS

## 2024-01-10 MED ORDER — KETAMINE HCL 50 MG/5ML IJ SOSY
PREFILLED_SYRINGE | INTRAMUSCULAR | Status: AC
  Filled 2024-01-10: qty 5

## 2024-01-10 MED ORDER — ZOLPIDEM TARTRATE 5 MG PO TABS: 5.0000 mg | ORAL_TABLET | Freq: Every evening | ORAL | Status: DC | PRN

## 2024-01-10 MED ORDER — TRANEXAMIC ACID-NACL 1000-0.7 MG/100ML-% IV SOLN
INTRAVENOUS | Status: AC
  Filled 2024-01-10: qty 100

## 2024-01-10 MED ORDER — HYDROMORPHONE HCL 1 MG/ML IJ SOLN
0.5000 mg | INTRAMUSCULAR | Status: DC | PRN
  Administered 2024-01-10 – 2024-01-11 (×5): 1 mg via INTRAVENOUS
  Administered 2024-01-12: 0.5 mg via INTRAVENOUS
  Administered 2024-01-12: 1 mg via INTRAVENOUS
  Administered 2024-01-12: 0.5 mg via INTRAVENOUS
  Filled 2024-01-10 (×8): qty 1

## 2024-01-10 MED ORDER — AMISULPRIDE (ANTIEMETIC) 5 MG/2ML IV SOLN: 10.0000 mg | Freq: Once | INTRAVENOUS | Status: DC | PRN

## 2024-01-10 MED ORDER — OXYCODONE HCL 5 MG PO TABS
10.0000 mg | ORAL_TABLET | ORAL | Status: DC | PRN
  Administered 2024-01-10 – 2024-01-11 (×5): 15 mg via ORAL
  Administered 2024-01-11: 10 mg via ORAL
  Administered 2024-01-11: 15 mg via ORAL
  Administered 2024-01-12 (×3): 10 mg via ORAL
  Administered 2024-01-12 – 2024-01-13 (×3): 15 mg via ORAL
  Filled 2024-01-10 (×4): qty 3
  Filled 2024-01-10 (×2): qty 2
  Filled 2024-01-10 (×5): qty 3

## 2024-01-10 MED ORDER — ROCURONIUM BROMIDE 10 MG/ML (PF) SYRINGE
PREFILLED_SYRINGE | INTRAVENOUS | Status: DC | PRN
  Administered 2024-01-10: 70 mg via INTRAVENOUS

## 2024-01-10 MED ORDER — MIDAZOLAM HCL 2 MG/2ML IJ SOLN
INTRAMUSCULAR | Status: AC
Start: 2024-01-10 — End: ?
  Filled 2024-01-10: qty 2

## 2024-01-10 MED ORDER — ACETAMINOPHEN 10 MG/ML IV SOLN
INTRAVENOUS | Status: AC
Start: 2024-01-10 — End: 2024-01-10
  Filled 2024-01-10: qty 100

## 2024-01-10 MED ORDER — VANCOMYCIN HCL 1000 MG IV SOLR
INTRAVENOUS | Status: AC
  Filled 2024-01-10: qty 20

## 2024-01-10 MED ORDER — ONDANSETRON HCL 4 MG/2ML IJ SOLN
INTRAMUSCULAR | Status: DC | PRN
  Administered 2024-01-10: 4 mg via INTRAVENOUS

## 2024-01-10 MED ORDER — BUPIVACAINE HCL 0.25 % IJ SOLN
INTRAMUSCULAR | Status: DC | PRN
  Administered 2024-01-10: 20 mL

## 2024-01-10 MED ORDER — CHLORHEXIDINE GLUCONATE CLOTH 2 % EX PADS
6.0000 | MEDICATED_PAD | Freq: Every day | CUTANEOUS | Status: DC
  Administered 2024-01-10 – 2024-01-11 (×2): 6 via TOPICAL

## 2024-01-10 MED ORDER — PROPOFOL 10 MG/ML IV BOLUS
INTRAVENOUS | Status: DC | PRN
  Administered 2024-01-10: 180 mg via INTRAVENOUS

## 2024-01-10 MED ORDER — LACTATED RINGERS IV SOLN: INTRAVENOUS | Status: DC

## 2024-01-10 MED ORDER — DOCUSATE SODIUM 100 MG PO CAPS
100.0000 mg | ORAL_CAPSULE | Freq: Two times a day (BID) | ORAL | Status: DC
  Administered 2024-01-10 – 2024-01-13 (×7): 100 mg via ORAL
  Filled 2024-01-10 (×7): qty 1

## 2024-01-10 MED ORDER — MUPIROCIN 2 % EX OINT: 1.0000 | TOPICAL_OINTMENT | Freq: Two times a day (BID) | CUTANEOUS | 0 refills | Status: AC

## 2024-01-10 MED ORDER — ACETAMINOPHEN 500 MG PO TABS
1000.0000 mg | ORAL_TABLET | Freq: Four times a day (QID) | ORAL | Status: AC
  Administered 2024-01-10 (×3): 1000 mg via ORAL
  Filled 2024-01-10 (×3): qty 2

## 2024-01-10 MED ORDER — PROPOFOL 10 MG/ML IV BOLUS
INTRAVENOUS | Status: AC
  Filled 2024-01-10: qty 20

## 2024-01-10 MED ORDER — OXYCODONE HCL 5 MG PO TABS: 5.0000 mg | ORAL_TABLET | Freq: Once | ORAL | Status: DC | PRN

## 2024-01-10 MED ORDER — HYDRALAZINE HCL 10 MG PO TABS: 10.0000 mg | ORAL_TABLET | Freq: Four times a day (QID) | ORAL | Status: DC | PRN

## 2024-01-10 MED ORDER — BUPIVACAINE-EPINEPHRINE (PF) 0.25% -1:200000 IJ SOLN
INTRAMUSCULAR | Status: AC
  Filled 2024-01-10: qty 30

## 2024-01-10 SURGICAL SUPPLY — 48 items
BAG COUNTER SPONGE SURGICOUNT (BAG) ×1 IMPLANT
BENZOIN TINCTURE PRP APPL 2/3 (GAUZE/BANDAGES/DRESSINGS) IMPLANT
BIT DRILL CROWE POINT TWST 4.3 (DRILL) IMPLANT
BLADE SURG 10 STRL SS (BLADE) ×2 IMPLANT
BNDG COHESIVE 4X5 TAN STRL LF (GAUZE/BANDAGES/DRESSINGS) ×1 IMPLANT
BNDG ELASTIC 4X5.8 VLCR STR LF (GAUZE/BANDAGES/DRESSINGS) ×1 IMPLANT
BNDG ELASTIC 6INX 5YD STR LF (GAUZE/BANDAGES/DRESSINGS) ×1 IMPLANT
BNDG ELASTIC 6X10 VLCR STRL LF (GAUZE/BANDAGES/DRESSINGS) IMPLANT
BNDG GAUZE DERMACEA FLUFF 4 (GAUZE/BANDAGES/DRESSINGS) ×1 IMPLANT
BRUSH SCRUB EZ PLAIN DRY (MISCELLANEOUS) ×2 IMPLANT
CHLORAPREP W/TINT 26 (MISCELLANEOUS) ×1 IMPLANT
CLSR STERI-STRIP ANTIMIC 1/2X4 (GAUZE/BANDAGES/DRESSINGS) IMPLANT
COVER SURGICAL LIGHT HANDLE (MISCELLANEOUS) ×2 IMPLANT
DRAPE C-ARM 42X72 X-RAY (DRAPES) ×1 IMPLANT
DRAPE C-ARMOR (DRAPES) ×1 IMPLANT
DRAPE HALF SHEET 40X57 (DRAPES) ×2 IMPLANT
DRAPE IMP U-DRAPE 54X76 (DRAPES) ×2 IMPLANT
DRAPE SURG ORHT 6 SPLT 77X108 (DRAPES) ×2 IMPLANT
DRAPE U-SHAPE 47X51 STRL (DRAPES) ×1 IMPLANT
DRILL SHORT 4.3 INTERLOCK (DRILL) IMPLANT
DRSG ADAPTIC 3X8 NADH LF (GAUZE/BANDAGES/DRESSINGS) ×1 IMPLANT
ELECTRODE REM PT RTRN 9FT ADLT (ELECTROSURGICAL) ×1 IMPLANT
GAUZE SPONGE 4X4 12PLY STRL (GAUZE/BANDAGES/DRESSINGS) ×1 IMPLANT
GLOVE BIO SURGEON STRL SZ 6.5 (GLOVE) ×3 IMPLANT
GLOVE BIO SURGEON STRL SZ7.5 (GLOVE) ×4 IMPLANT
GLOVE BIOGEL PI IND STRL 6.5 (GLOVE) ×1 IMPLANT
GLOVE BIOGEL PI IND STRL 7.5 (GLOVE) ×1 IMPLANT
GOWN STRL REUS W/ TWL LRG LVL3 (GOWN DISPOSABLE) ×2 IMPLANT
GUIDEPIN 3.2X17.5 THRD DISP (PIN) IMPLANT
GUIDWIRE 80 BEAD TIP (WIRE) IMPLANT
KIT BASIN OR (CUSTOM PROCEDURE TRAY) ×1 IMPLANT
KIT TURNOVER KIT B (KITS) ×1 IMPLANT
NAIL TIB AFFIX ST 10X390 (Nail) IMPLANT
PACK TOTAL JOINT (CUSTOM PROCEDURE TRAY) ×1 IMPLANT
PAD ARMBOARD POSITIONER FOAM (MISCELLANEOUS) ×2 IMPLANT
PADDING CAST ABS COTTON 4X4 ST (CAST SUPPLIES) IMPLANT
PADDING CAST ABS COTTON 6X4 NS (CAST SUPPLIES) IMPLANT
SCREW CORT AFFIX ST 5X40 (Screw) IMPLANT
SCREW CORT AFFIX ST 5X50 (Screw) IMPLANT
SCREW CORT AFFIX ST 5X65 (Screw) IMPLANT
SCREW CORTICAL BONE 5X36 ST (Screw) IMPLANT
STAPLER VISISTAT 35W (STAPLE) ×1 IMPLANT
SUT MNCRL AB 3-0 PS2 18 (SUTURE) ×1 IMPLANT
SUT MNCRL AB 3-0 PS2 27 (SUTURE) IMPLANT
SUT MON AB 2-0 CT1 36 (SUTURE) IMPLANT
SUT VIC AB 0 CT1 27XBRD ANBCTR (SUTURE) IMPLANT
TOWEL GREEN STERILE (TOWEL DISPOSABLE) ×2 IMPLANT
TOWEL GREEN STERILE FF (TOWEL DISPOSABLE) ×1 IMPLANT

## 2024-01-10 NOTE — Discharge Instructions (Signed)
[facility] assistance programs. If you are behind on your bills and expenses, and need some help to make it through a short term hardship or financial emergency, there are several organizations and charities in the [place] area that may be able to help. They range from the [employer], [employer], Landscape architect of [employer] and the local community action agency, the [employer], [employer]. These groups may be able to provide you resources to help pay your utility bills, rent, and they even offer housing assistance.  Crisis assistance program Find help for paying your rent, electric bills, free food, and even funds to pay your mortgage. The [employer] (850) 408-9249) offers several services to local families, as funding allows. The Emergency Assistance Program (EAP), which they administer, provides household goods, free food, clothing, and financial aid to people in need in the [facility] area. The EAP program does have some qualification, and counselors will interview clients for financial assistance by written referral only. Referrals need to be made by the Department of Social Services or by other EAP approved human services agencies or charities in the area.  Money for resources for emergency assistance are available for security deposits for rent, water, electric, and gas, past due rent, utility bills, past due mortgage payments, food, and clothing. The [employer] also operates a Programme researcher, broadcasting/film/video on the site. More [employer].  [employer] of [employer], which can be reached at [phone], offers emergency assistance programs for those in need of help, such as food, rent assistance, a soup kitchen, shelter, and clothing. They are based in [facility] but provide a number of services to those that qualify for assistance. Continue with [employer]  programs.  [facility] Department of Social Services may be able to offer temporary financial assistance and cash grants for paying rent and utilities. Help may be provided for local county residents who may be experiencing personal crisis when other resources, including government programs, are not available. Call [phone]            [employer], which is based in [place], provides financial assistance of up to $50.00 to help pay for rent, utilities, cooling bills, rent, and prescription medications. The program also provides secondhand furniture to those in need. [phone]  [employer] is a Geneticist, molecular. The organization can offer emergency assistance for paying rent, electric bills, utilities, food, household products and furniture. They offer extensive emergency and transitional housing for families, children and single women, and also run a Boy's and Dole Food. [address] [employer] [address], [employer], and other aid offered too. [address] [place], [place] [address], [phone]  Additional locations of the [employer] are in [place] and other nearby communities. When you have an emergency, need free food, money for basic needs, or just need assistance around Christmas, then the [employer] may have the resources you need. Or they can refer you to nearby agencies. Learn more.  [place] Low Income Risk manager - This is offered for [facility] families. The federal government created [employer] Program provides a one-time cash grant payment to help eligible low-income families pay their electric and heating bills. [address] [place], [place] [address], [phone]  Government and [employer] - The county administers several emergency and self-sufficiency programs. Residents of [place]  can get help with energy bills and food, rent, and other  expenses. In addition, work with a Sports coach who may be able to help you find a job or improve your employment skills. More [place] public assistance.  [place] Emergency Assistance - A program offers emergency utility and rent funds for greater [place] area residents. The program can also provide counseling and referrals to charities and government programs. Also provides food and a free meal program that serves lunch Mondays - Saturdays and dinner seven days per week to individuals in the community. [address] [place], [place] [address], [phone]  [employer] - Offers affordable apartment and housing communities across [place] and [place]. The low income and seniors can access public housing, rental assistance to qualified applicants, and apply for the section 8 rent subsidy program. Other programs include Chiropractor and Engineer, maintenance. [address] [place], [place] [address], dial [phone].  Basic needs such as clothing - Low income families can receive free items (school supplies, clothes, holiday assistance, etc.) from clothing closets while more moderate income [address] families can shop at [employer]. Locations across the area help the needy. Get information on [place] free clothing centers.  The [facility] provides transitional housing to veterans and the disabled. Clients will also access other services too, including life skills classes, case management, and assistance in finding permanent housing. [address], [place], [place] [address], call [phone]  [facility] Transitional Housing in [place] is for people who were just evicted or that are formerly homeless. The non-profit will also help then gain self-sufficiency, find a home or apartment to live in, and also provides information on rent assistance when needed.  Dial [phone]  [employer] Partnership to End Homelessness is available in [place]. Families that were evicted or that are homeless can gain shelter, food, clothing, furniture, and also emergency financial assistance. Other services include financial skills and life skills coaching, job training, and case management. [address], [place], [place] [address]. Telephone [phone].  The [employer], [employer]. runs the [employer]. This can help people save money on their heating and summer cooling bills, and is free to low income families. Free upgrades can be made to your home. Phone [phone]  Many of the non-profits and programs mentioned above are all inclusive, meaning they can meet many needs of the low income, such as energy bills, food, rent, and more. However there are several organizations that focus just on rent and housing. Read more on rent assistance in [place] region.  Legal assistance for evictions, foreclosure, and more If you need free legal advice on civili issues, such as foreclosures, evictions, Electronics engineer, government programs, domestic issues and more, Armed forces operational officer Aid of [facility]) is a Associate Professor firm that provides free legal services and counsel to lower income people, seniors, disabled, and others. The goal is to ensure everyone has access to justice and fair representation.  Call them at ([phone], or click here to learn more about [place] free legal assistance programs.  [place] [employer] and funds for emergency expenses The [employer] is another organization that can provide people with [employer] and funds to pay bills. Their assistance depends on funding, and the demand for help is always very high. They can provide cash to help pay rent, a missed mortgage payment, or gas, electric, and water bills. But the  assistance doesn't stop there. They also have a food pantry on site, which can  provide food once every three (3) months to people who need help. The [employer] can also offer a Engineering geologist once every three (3) months for a maximum three (3) times. After receiving this voucher over that period of time, applicants can receive this aid one every six (6) months after that. [phone].  Kohl's action agency The [employer], [employer]. offers job and Dispensing optician. Resources are focused on helping students obtain the skills and experiences that are necessary to compete in today's challenging and tight job market. The non-profit faith-based community action agency offers internship trainings as well as classroom instruction. Economically disadvantaged and challenged individuals and potential employers can use their services. Classes are tailored to meet the needs of people in the [facility] region. [place], [place] [address], [phone]               Foreclosure prevention services Housing Counseling and Education is also offered by [employer] of the [place]. The agency (phone number is below) is a Engineer, structural providing foreclosure advice and counseling. They offer mortgage resolution counseling and also reverse mortgage counseling. Counselors can direct people to both [employer]ark, as well as [employer] foreclosure assistance options.  Warehouse manager has locations in [place] and [place]. They run debt and foreclosure prevention programs for local families. A sampling of the programs offered include both Budget and Housing Counseling. This includes money management, financial advice, budget review and development of a written action plan with a Pensions consultant to help solve specific individual financial problems. In addition, housing and mortgage counselors can also provide pre-  and post-purchase homeownership counseling, default resolution counseling (to prevent foreclosure) and reverse mortgage counseling. A Debt Management Program allows people and families with a high level of credit card or medical debt to consolidate and repay consumer debt and loans to creditors and rebuild positive credit ratings and scores. [phone]  Debt assistance programs Receive free counseling and debt help from [facility] of the [place]. The [facility] based agency can be reached at [phone]. The counselors provide free help, and the services include budget counseling. This will help people manage their expenses and set goals. They also offer a Forensic scientist, which will help individuals consolidate their debts and become debt free. Most of the workshops and services are free.  Community clinics in [place] of the leading health and dental centers are listed below. They may be able to provide medication, physicals, dental care, and general family care to residents of all incomes and backgrounds across the region. Some of the programs focus on the low income and underinsured. However if these clinics can't meet your needs, find information and details on more clinics in [facility].  Some of the options include [employer] of [employer]. This center provides free or low cost health care to low-income adults 18 - 64, who have no health insurance. Among other services offered include a pharmacy and eye clinic. Phone [phone]  [facility], which is located in [place], is a community clinic that provides primary medical and health care to uninsured and underinsured adults and families, as well as the low income, in the greater [place] area on a sliding-fee scale. Call [phone]  [place] [facility] -  They run a dental assistance program that is organized by [employer] [facility] to provide  dental services and aid to [place] residents. Services offered by the dental clinic are limited to extractions, pain management, and minor restorative care. [phone]  [place] [facility] has locations in [place] and [place]. The community clinics provide complete pediatric care including primary health, mental health, social work, neurology, cardiology, asthma. Dial [phone].  In addition to those [address] [place] and [facility] community clinics, find other free community clinics in [place] and across the county.  Food pantry and assistance Some of the local food pantries and distribution centers to call for free food and groceries include The Hive of [place] (phone [phone]), The [facility] (phone [phone]) and also [employer] Corporation[employer] Assistance Incorporated. Dial [phone].  Several other food banks in the region provide clothing, free food and meals, access to soup kitchens and other help. Find the addresses and phone numbers of more food pantries in [place]. http://[email or web address].[email or web address]/html/[place]_assistance_pro.[email or web address]

## 2024-01-10 NOTE — Anesthesia Postprocedure Evaluation (Signed)
 Anesthesia Post Note  Patient: Shawn Proctor  Procedure(s) Performed: INSERTION, INTRAMEDULLARY ROD, TIBIA (Right)     Patient location during evaluation: PACU Anesthesia Type: General Level of consciousness: awake and alert Pain management: pain level controlled Vital Signs Assessment: post-procedure vital signs reviewed and stable Respiratory status: spontaneous breathing, nonlabored ventilation, respiratory function stable and patient connected to nasal cannula oxygen Cardiovascular status: blood pressure returned to baseline and stable Postop Assessment: no apparent nausea or vomiting Anesthetic complications: no  No notable events documented.  Last Vitals:  Vitals:   01/10/24 1215 01/10/24 1230  BP: 123/85 124/85  Pulse: 96 92  Resp: 14 16  Temp: 36.8 C 36.8 C  SpO2: 97% 99%    Last Pain:  Vitals:   01/10/24 1326  TempSrc:   PainSc: 10-Worst pain ever                 Rosalita Combe

## 2024-01-10 NOTE — Progress Notes (Signed)
 Orthopedic Tech Progress Note Patient Details:  Shawn Proctor 12/12/1980 [id number]  Ortho Devices Type of Ortho Device: CAM walker Ortho Device/Splint Location: RLE Ortho Device/Splint Interventions: Application, Ordered   Post Interventions Patient Tolerated: Well  [name] [name] 01/10/2024, 11:47 AM

## 2024-01-10 NOTE — Op Note (Signed)
 Orthopaedic Surgery Operative Note (CSN: [id number] ) Date of Surgery: 01/10/2024  Admit Date: 01/09/2024   Diagnoses: Pre-Op Diagnoses: Right tibial shaft fracture  Post-Op Diagnosis: Same  Procedures: CPT 27759-Intramedullary nailing of right tibial shaft fracture  Surgeons : Primary: Laneta Pintos, MD  Assistant: Alona Jamaica, PA-C  Location: OR 3   Anesthesia: General   Antibiotics: Ancef 2g preop with 1 gm vancomycin powder placed topically   Tourniquet time: None    Estimated Blood Loss: 100 mL  Complications: None   Specimens:* No specimens in log *   Implants: Implant Name Type Inv. Item Serial No. Manufacturer Lot No. LRB No. Used Action  NAIL TIB AFFIX ST 10X390 - [id number] Nail NAIL TIB AFFIX ST 10X390  [employer](ORTH,TRAU,BIO,SG) [id number] Right 1 Implanted  SCREW CORT AFFIX ST 5X50 - [id number] Screw SCREW CORT AFFIX ST 5X50  [employer](ORTH,TRAU,BIO,SG) [id number] Right 1 Implanted  SCREW CORTICAL BONE 5X36 ST - [id number] Screw SCREW CORTICAL BONE 5X36 ST  [employer](ORTH,TRAU,BIO,SG) [id number] Right 1 Implanted  SCREW CORT AFFIX ST 5X65 - [id number] Screw SCREW CORT AFFIX ST 5X65  [employer](ORTH,TRAU,BIO,SG) [id number] Right 1 Implanted  SCREW CORT AFFIX ST 5X40 - [id number] Screw SCREW CORT AFFIX ST 5X40  [employer](ORTH,TRAU,BIO,SG) [id number] Right 1 Implanted     Indications for Surgery: 43 year old male who sustained a right distal tibial shaft fracture.  Due to the unstable nature of his injury I recommend proceeding with intramedullary nailing of his right tibia.  Risks and benefits were discussed with the patient.  Risks include but not limited to bleeding, infection, malunion, nonunion, hardware failure, hardware rotation, nerve and blood vessel injury, DVT, even the possibility anesthetic complications.  He agreed to proceed with surgery and consent was obtained.  Operative Findings: Intramedullary nailing of right tibial shaft fracture  using [employer] Affixus 10 x 390 mm nail  Procedure: The patient was identified in the preoperative holding area. Consent was confirmed with the patient and their family and all questions were answered. The operative extremity was marked after confirmation with the patient. he was then brought back to the operating room by our anesthesia colleagues.  He was placed under general anesthetic and carefully transferred over to radiolucent flattop table.  A bump was placed under his operative hip.  The right lower extremity was then prepped and draped in usual sterile fashion.  A timeout was performed to verify the patient, the procedure, and the extremity.  Preoperative antibiotics were dosed.  Fluoroscopic imaging showed the unstable nature of his injury.  Lateral parapatellar incision was made and carried down through skin subcutaneous tissue.  Incised through the retinaculum to mobilize the patella medially.  I then directed a threaded guidewire at appropriate starting point and advanced into the proximal metaphysis.  I then used an awl to enter the medullary canal.  I then passed a ball-tipped guidewire down the center of the canal and seated it into the distal metaphysis and crossing the fracture.  I then measured the length and chose to use a 390 mm nail.  I then sequentially reamed from 8 mm to 11 mm and obtained excellent chatter.  I decided to place a 10 mm nail.  The 10 mm was attached to a targeting arm and placed down the center of the canal.  The fracture aligned appropriately.  I then used perfect circle technique to place 2 medial to lateral distal interlocking screws.  I then used the targeting arm to place 2 proximal interlocking screws.  The targeting arm was removed and final fluoroscopic imaging was obtained.  The incisions were copiously irrigated.  Vancomycin powder was placed to the incision.  A layered closure of 0 Vicryl, 2-0 Monocryl and 3-0 Monocryl with Steri-Strips were used to  close the skin.  Local anesthetic with Marcaine was injected into the subcutaneous tissue for adequate pain control.  Sterile dressings were applied.  The patient was then awoke from anesthesia and taken to the PACU in stable condition.  Post Op Plan/Instructions: Patient may be weightbearing as tolerated to the right lower extremity in a walking boot.  He will receive postoperative Ancef.  He will receive Lovenox for DVT prophylaxis and discharged on aspirin 81 mg.  Will have him mobilize with physical and Occupational Therapy.  I was present and performed the entire surgery.  Alona Jamaica, PA-C did assist me throughout the case. An assistant was necessary given the difficulty in approach, maintenance of reduction and ability to instrument the fracture.   Katheryne Pane, MD [facility]

## 2024-01-10 NOTE — Anesthesia Preprocedure Evaluation (Addendum)
 Anesthesia Evaluation  Patient identified by MRN, date of birth, ID band Patient awake    Reviewed: Allergy & Precautions, NPO status , Patient's Chart, lab work & pertinent test results  Airway Mallampati: II  TM Distance: >3 FB Neck ROM: Full    Dental no notable dental hx. (+) Teeth Intact, Dental Advisory Given   Pulmonary neg pulmonary ROS   Pulmonary exam normal breath sounds clear to auscultation       Cardiovascular negative cardio ROS Normal cardiovascular exam Rhythm:Regular Rate:Normal     Neuro/Psych negative neurological ROS  negative psych ROS   GI/Hepatic negative GI ROS, Neg liver ROS,,,  Endo/Other  negative endocrine ROS    Renal/GU negative Renal ROSLab Results      Component                Value               Date                      NA                       141                 01/09/2024                CL                       106                 01/09/2024                K                        3.8                 01/09/2024                CO2                      22                  01/09/2024                BUN                      16                  01/09/2024                CREATININE               0.93                01/09/2024                GFRNONAA                 >60                 01/09/2024                CALCIUM                  9.0                 01/09/2024  ALBUMIN                  4.3                 02/11/2019                GLUCOSE                  90                  01/09/2024                Musculoskeletal   Abdominal   Peds  Hematology  (+) HIVLab Results      Component                Value               Date                      WBC                      9.9                 01/09/2024                HGB                      14.8                01/09/2024                HCT                      43.8                01/09/2024                MCV                       94.8                01/09/2024                PLT                      202                 01/09/2024              Anesthesia Other Findings All:  Clindamycin, Lincomycin  Reproductive/Obstetrics                             Anesthesia Physical Anesthesia Plan  ASA: 2  Anesthesia Plan: General   Post-op Pain Management: Tylenol  PO (pre-op)* and Ketamine IV*   Induction: Intravenous  PONV Risk Score and Plan: 3 and Treatment may vary due to age or medical condition, Midazolam and Ondansetron  Airway Management Planned: Oral ETT  Additional Equipment: None  Intra-op Plan:   Post-operative Plan: Extubation in OR  Informed Consent: I have reviewed the patients History and Physical, chart, labs and discussed the procedure including the risks, benefits and alternatives for the proposed anesthesia with the patient or authorized representative who has indicated his/her understanding and acceptance.     Dental advisory given  Plan Discussed with: CRNA  and Surgeon  Anesthesia Plan Comments:        Anesthesia Quick Evaluation

## 2024-01-10 NOTE — H&P (View-Only) (Signed)
 Orthopaedic Trauma Service (OTS) Consult   Patient ID: Shawn Proctor MRN: [id number] DOB/AGE: 05/18/1981 43 y.o.  Reason for Consult: Right tibia fracture Referring Physician: Dr. Carter Clare, MD [facility]  HPI: Shawn Proctor is an 43 y.o. male who is being seen in consultation at the request of Dr. Hiram Lukes for evaluation of right tibia fracture.  Patient was trying to get ready for training to visit his mother from Mother's Day when his leg got caught up underneath some things are on the floor if he twisted fell awkwardly and had an immediate pain and deformity.  Brought to [facility] emergency room where x-rays showed a distal tibial shaft fracture and a proximal fibula fracture.  Due to the OR availability Dr. Hiram Lukes asked if I could manage him primarily.  Patient was transferred over to [facility].  Patient was seen in the preoperative holding area.  Currently having a lot of pain in his right leg.  Denies any injury anywhere else.  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  He does have HIV but he is on medications and he has undetectable loads recently.  Denies any significant tobacco use.  Denies any other issues.  Past Medical History:  Diagnosis Date   HIV infection (HCC)     History reviewed. No pertinent surgical history.  History reviewed. No pertinent family history.  Social History:  reports that he has never smoked. He has never used smokeless tobacco. He reports that he does not currently use alcohol. He reports that he does not currently use drugs.  Allergies:  Allergies  Allergen Reactions   Clindamycin/Lincomycin     Medications:  No current facility-administered medications on file prior to encounter.   Current Outpatient Medications on File Prior to Encounter  Medication Sig Dispense Refill   aspirin EC 81 MG tablet Take 81 mg by mouth every 6 (six) hours as needed for moderate pain (pain score 4-6). Swallow whole.      bictegravir-emtricitabine-tenofovir AF (BIKTARVY ) 50-200-25 MG TABS tablet Take 1 tablet by mouth daily. 30 tablet 0   zolpidem (AMBIEN) 5 MG tablet Take 5 mg by mouth at bedtime as needed for sleep.     valACYclovir  (VALTREX ) 1000 MG tablet TAKE 1 TABLET(1000 MG) BY MOUTH TWICE DAILY (Patient not taking: Reported on 05/09/2023) 14 tablet 1     ROS: Constitutional: No fever or chills Vision: No changes in vision ENT: No difficulty swallowing CV: No chest pain Pulm: No SOB or wheezing GI: No nausea or vomiting GU: No urgency or inability to hold urine Skin: No poor wound healing Neurologic: No numbness or tingling Psychiatric: No depression or anxiety Heme: No bruising Allergic: No reaction to medications or food   Exam: Blood pressure (!) 154/95, pulse 100, temperature 98.8 F (37.1 C), temperature source Oral, resp. rate 18, height 6\' 2"  (1.88 m), weight 104.3 kg, SpO2 95%. General: No acute distress Orientation: Awake alert and oriented x 3 Mood and Affect: Cooperative and pleasant Gait: Unable to assess due to his fracture Coordination and balance: Within normal limits  Right lower extremity: Short leg splint is in place, it is clean dry and intact.  Compartments are soft and compressible.  They are swollen but appropriately so.  No deformity through the distal femur.  He has sensation intact to light touch to the dorsum and plantar aspect of his foot.  He is warm well-perfused foot with brisk cap refill less than 2 seconds  Left lower extremity: Skin without lesions. No tenderness to palpation. Full painless ROM, full strength in each muscle groups without evidence of instability.   Medical Decision Making: Data: Imaging: X-rays are reviewed which shows a spiral oblique distal tibial shaft fracture with a proximal third fibular shaft fracture.  Labs:  Results for orders placed or performed during the hospital encounter of 01/09/24 (from the past 24 hours)  Basic metabolic  panel     Status: None   Collection Time: 01/09/24  6:56 PM  Result Value Ref Range   Sodium 141 135 - 145 mmol/L   Potassium 3.8 3.5 - 5.1 mmol/L   Chloride 106 98 - 111 mmol/L   CO2 22 22 - 32 mmol/L   Glucose, Bld 90 70 - 99 mg/dL   BUN 16 6 - 20 mg/dL   Creatinine, Ser 3.47 0.61 - 1.24 mg/dL   Calcium 9.0 8.9 - 42.5 mg/dL   GFR, Estimated >95 >63 mL/min   Anion gap 13 5 - 15  CBC with Differential     Status: None   Collection Time: 01/09/24  6:56 PM  Result Value Ref Range   WBC 9.9 4.0 - 10.5 K/uL   RBC 4.62 4.22 - 5.81 MIL/uL   Hemoglobin 14.8 13.0 - 17.0 g/dL   HCT 87.5 64.3 - 32.9 %   MCV 94.8 80.0 - 100.0 fL   MCH 32.0 26.0 - 34.0 pg   MCHC 33.8 30.0 - 36.0 g/dL   RDW 51.8 84.1 - 66.0 %   Platelets 202 150 - 400 K/uL   nRBC 0.0 0.0 - 0.2 %   Neutrophils Relative % 68 %   Neutro Abs 6.7 1.7 - 7.7 K/uL   Lymphocytes Relative 23 %   Lymphs Abs 2.3 0.7 - 4.0 K/uL   Monocytes Relative 9 %   Monocytes Absolute 0.9 0.1 - 1.0 K/uL   Eosinophils Relative 0 %   Eosinophils Absolute 0.0 0.0 - 0.5 K/uL   Basophils Relative 0 %   Basophils Absolute 0.0 0.0 - 0.1 K/uL   Immature Granulocytes 0 %   Abs Immature Granulocytes 0.01 0.00 - 0.07 K/uL  Surgical PCR screen     Status: Abnormal   Collection Time: 01/10/24 12:34 AM   Specimen: Nasal Mucosa; Nasal Swab  Result Value Ref Range   MRSA, PCR POSITIVE (A) NEGATIVE   Staphylococcus aureus POSITIVE (A) NEGATIVE     Imaging or Labs ordered: None  Medical history and chart was reviewed and case discussed with medical provider.  Assessment/Plan: 43 year old male with a displaced right distal tibial shaft fracture.  Due to the unstable nature of his injury I recommend proceeding with intramedullary nailing of his right tibia.  Risks and benefits were discussed with the patient.  Risks include but not limited to bleeding, infection, malunion, nonunion, hardware failure, hardware irritation, nerve or blood vessel injury,  compartment syndrome, DVT, even the possibility anesthetic occasions.  He agreed to proceed with surgery and consent was obtained.  Laneta Pintos, MD [facility] [phone] (office) [email or web address]

## 2024-01-10 NOTE — Progress Notes (Signed)
   01/10/24 1249  TOC Brief Assessment  Insurance and Status Reviewed  Patient has primary care physician No  Home environment has been reviewed see note  Prior level of function: independent  Prior/Current Home Services No current home services  Social Drivers of Health Review  (see note)  Readmission risk has been reviewed Yes     Spoke to patient at bedside.    Patient confirmed Shawn Proctor address in EPIC is correct, however states he is homeless. Patient states he has friends that he can stay with at discharge in [place] , he is not sure which one yet.   TOC has shelter list , patient is confident he will be able to stay with a friend and declined shelter list.   Prior to admission patient was independent and did not use any DME.   Patient does not have a PCP . Patient does have Medicaid , therefore has medicaid transportation.   Patient unsure of discharge address at present. He is aware he can call number on medicaid card and be provided a list of providers in network.   TOC will continue to follow for PT recommendations.

## 2024-01-10 NOTE — Consult Note (Signed)
 Orthopaedic Trauma Service (OTS) Consult   Patient ID: Shawn Proctor MRN: [id number] DOB/AGE: 05/18/1981 43 y.o.  Reason for Consult: Right tibia fracture Referring Physician: Dr. Carter Clare, MD [facility]  HPI: Shawn Proctor is an 43 y.o. male who is being seen in consultation at the request of Dr. Hiram Lukes for evaluation of right tibia fracture.  Patient was trying to get ready for training to visit his mother from Mother's Day when his leg got caught up underneath some things are on the floor if he twisted fell awkwardly and had an immediate pain and deformity.  Brought to [facility] emergency room where x-rays showed a distal tibial shaft fracture and a proximal fibula fracture.  Due to the OR availability Dr. Hiram Lukes asked if I could manage him primarily.  Patient was transferred over to [facility].  Patient was seen in the preoperative holding area.  Currently having a lot of pain in his right leg.  Denies any injury anywhere else.  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  He does have HIV but he is on medications and he has undetectable loads recently.  Denies any significant tobacco use.  Denies any other issues.  Past Medical History:  Diagnosis Date   HIV infection (HCC)     History reviewed. No pertinent surgical history.  History reviewed. No pertinent family history.  Social History:  reports that he has never smoked. He has never used smokeless tobacco. He reports that he does not currently use alcohol. He reports that he does not currently use drugs.  Allergies:  Allergies  Allergen Reactions   Clindamycin/Lincomycin     Medications:  No current facility-administered medications on file prior to encounter.   Current Outpatient Medications on File Prior to Encounter  Medication Sig Dispense Refill   aspirin EC 81 MG tablet Take 81 mg by mouth every 6 (six) hours as needed for moderate pain (pain score 4-6). Swallow whole.      bictegravir-emtricitabine-tenofovir AF (BIKTARVY ) 50-200-25 MG TABS tablet Take 1 tablet by mouth daily. 30 tablet 0   zolpidem (AMBIEN) 5 MG tablet Take 5 mg by mouth at bedtime as needed for sleep.     valACYclovir  (VALTREX ) 1000 MG tablet TAKE 1 TABLET(1000 MG) BY MOUTH TWICE DAILY (Patient not taking: Reported on 05/09/2023) 14 tablet 1     ROS: Constitutional: No fever or chills Vision: No changes in vision ENT: No difficulty swallowing CV: No chest pain Pulm: No SOB or wheezing GI: No nausea or vomiting GU: No urgency or inability to hold urine Skin: No poor wound healing Neurologic: No numbness or tingling Psychiatric: No depression or anxiety Heme: No bruising Allergic: No reaction to medications or food   Exam: Blood pressure (!) 154/95, pulse 100, temperature 98.8 F (37.1 C), temperature source Oral, resp. rate 18, height 6\' 2"  (1.88 m), weight 104.3 kg, SpO2 95%. General: No acute distress Orientation: Awake alert and oriented x 3 Mood and Affect: Cooperative and pleasant Gait: Unable to assess due to his fracture Coordination and balance: Within normal limits  Right lower extremity: Short leg splint is in place, it is clean dry and intact.  Compartments are soft and compressible.  They are swollen but appropriately so.  No deformity through the distal femur.  He has sensation intact to light touch to the dorsum and plantar aspect of his foot.  He is warm well-perfused foot with brisk cap refill less than 2 seconds  Left lower extremity: Skin without lesions. No tenderness to palpation. Full painless ROM, full strength in each muscle groups without evidence of instability.   Medical Decision Making: Data: Imaging: X-rays are reviewed which shows a spiral oblique distal tibial shaft fracture with a proximal third fibular shaft fracture.  Labs:  Results for orders placed or performed during the hospital encounter of 01/09/24 (from the past 24 hours)  Basic metabolic  panel     Status: None   Collection Time: 01/09/24  6:56 PM  Result Value Ref Range   Sodium 141 135 - 145 mmol/L   Potassium 3.8 3.5 - 5.1 mmol/L   Chloride 106 98 - 111 mmol/L   CO2 22 22 - 32 mmol/L   Glucose, Bld 90 70 - 99 mg/dL   BUN 16 6 - 20 mg/dL   Creatinine, Ser 3.47 0.61 - 1.24 mg/dL   Calcium 9.0 8.9 - 42.5 mg/dL   GFR, Estimated >95 >63 mL/min   Anion gap 13 5 - 15  CBC with Differential     Status: None   Collection Time: 01/09/24  6:56 PM  Result Value Ref Range   WBC 9.9 4.0 - 10.5 K/uL   RBC 4.62 4.22 - 5.81 MIL/uL   Hemoglobin 14.8 13.0 - 17.0 g/dL   HCT 87.5 64.3 - 32.9 %   MCV 94.8 80.0 - 100.0 fL   MCH 32.0 26.0 - 34.0 pg   MCHC 33.8 30.0 - 36.0 g/dL   RDW 51.8 84.1 - 66.0 %   Platelets 202 150 - 400 K/uL   nRBC 0.0 0.0 - 0.2 %   Neutrophils Relative % 68 %   Neutro Abs 6.7 1.7 - 7.7 K/uL   Lymphocytes Relative 23 %   Lymphs Abs 2.3 0.7 - 4.0 K/uL   Monocytes Relative 9 %   Monocytes Absolute 0.9 0.1 - 1.0 K/uL   Eosinophils Relative 0 %   Eosinophils Absolute 0.0 0.0 - 0.5 K/uL   Basophils Relative 0 %   Basophils Absolute 0.0 0.0 - 0.1 K/uL   Immature Granulocytes 0 %   Abs Immature Granulocytes 0.01 0.00 - 0.07 K/uL  Surgical PCR screen     Status: Abnormal   Collection Time: 01/10/24 12:34 AM   Specimen: Nasal Mucosa; Nasal Swab  Result Value Ref Range   MRSA, PCR POSITIVE (A) NEGATIVE   Staphylococcus aureus POSITIVE (A) NEGATIVE     Imaging or Labs ordered: None  Medical history and chart was reviewed and case discussed with medical provider.  Assessment/Plan: 43 year old male with a displaced right distal tibial shaft fracture.  Due to the unstable nature of his injury I recommend proceeding with intramedullary nailing of his right tibia.  Risks and benefits were discussed with the patient.  Risks include but not limited to bleeding, infection, malunion, nonunion, hardware failure, hardware irritation, nerve or blood vessel injury,  compartment syndrome, DVT, even the possibility anesthetic occasions.  He agreed to proceed with surgery and consent was obtained.  Laneta Pintos, MD [facility] [phone] (office) [email or web address]

## 2024-01-10 NOTE — Interval H&P Note (Signed)
 History and Physical Interval Note:  01/10/2024 9:07 AM  Shawn Proctor  has presented today for surgery, with the diagnosis of RIGHT TIBIA FRACTURE.  The various methods of treatment have been discussed with the patient and family. After consideration of risks, benefits and other options for treatment, the patient has consented to  Procedure(s): INSERTION, INTRAMEDULLARY ROD, TIBIA (Right) as a surgical intervention.  The patient's history has been reviewed, patient examined, no change in status, stable for surgery.  I have reviewed the patient's chart and labs.  Questions were answered to the patient's satisfaction.     [name] P Murrel Freet

## 2024-01-10 NOTE — Transfer of Care (Signed)
 Immediate Anesthesia Transfer of Care Note  Patient: Shawn Proctor  Procedure(s) Performed: INSERTION, INTRAMEDULLARY ROD, TIBIA (Right)  Patient Location: PACU  Anesthesia Type:General  Level of Consciousness: drowsy  Airway & Oxygen Therapy: Patient Spontanous Breathing and Patient connected to face mask oxygen  Post-op Assessment: Report given to RN and Post -op Vital signs reviewed and stable  Post vital signs: Reviewed and stable  Last Vitals:  Vitals Value Taken Time  BP 121/78 01/10/24 1115  Temp    Pulse 109 01/10/24 1120  Resp 19 01/10/24 1120  SpO2 100 % 01/10/24 1120  Vitals shown include unfiled device data.  Last Pain:  Vitals:   01/10/24 0902  TempSrc:   PainSc: 8          Complications: No notable events documented.

## 2024-01-10 NOTE — Anesthesia Procedure Notes (Signed)
 Procedure Name: Intubation Date/Time: 01/10/2024 9:42 AM  Performed by: Merna Aase, CRNAPre-anesthesia Checklist: Patient identified, Patient being monitored, Timeout performed, Emergency Drugs available and Suction available Patient Re-evaluated:Patient Re-evaluated prior to induction Oxygen Delivery Method: Circle system utilized Preoxygenation: Pre-oxygenation with 100% oxygen Induction Type: IV induction Ventilation: Mask ventilation without difficulty Laryngoscope Size: Mac and 4 Grade View: Grade II Tube type: Oral Tube size: 7.5 mm Number of attempts: 1 Airway Equipment and Method: Stylet Placement Confirmation: ETT inserted through vocal cords under direct vision, positive ETCO2 and breath sounds checked- equal and bilateral Secured at: 23 cm Tube secured with: Tape Dental Injury: Teeth and Oropharynx as per pre-operative assessment

## 2024-01-10 NOTE — Progress Notes (Signed)
   01/10/24 1254  SDOH Interventions  Food Insecurity Interventions Inpatient TOC   Resources placed on AVS

## 2024-01-11 ENCOUNTER — Other Ambulatory Visit (HOSPITAL_COMMUNITY): Payer: Self-pay

## 2024-01-11 ENCOUNTER — Encounter (HOSPITAL_COMMUNITY): Payer: Self-pay | Admitting: Student

## 2024-01-11 LAB — CBC
HCT: 36.5 % — ABNORMAL LOW (ref 39.0–52.0)
Hemoglobin: 12.8 g/dL — ABNORMAL LOW (ref 13.0–17.0)
MCH: 32.5 pg (ref 26.0–34.0)
MCHC: 35.1 g/dL (ref 30.0–36.0)
MCV: 92.6 fL (ref 80.0–100.0)
Platelets: 159 10*3/uL (ref 150–400)
RBC: 3.94 MIL/uL — ABNORMAL LOW (ref 4.22–5.81)
RDW: 13 % (ref 11.5–15.5)
WBC: 9.3 10*3/uL (ref 4.0–10.5)
nRBC: 0 % (ref 0.0–0.2)

## 2024-01-11 LAB — BASIC METABOLIC PANEL WITH GFR
Anion gap: 9 (ref 5–15)
BUN: 19 mg/dL (ref 6–20)
CO2: 19 mmol/L — ABNORMAL LOW (ref 22–32)
Calcium: 8.1 mg/dL — ABNORMAL LOW (ref 8.9–10.3)
Chloride: 106 mmol/L (ref 98–111)
Creatinine, Ser: 0.87 mg/dL (ref 0.61–1.24)
GFR, Estimated: >60 mL/min (ref >60)
Glucose, Bld: 170 mg/dL — ABNORMAL HIGH (ref 70–99)
Potassium: 4.1 mmol/L (ref 3.5–5.1)
Sodium: 134 mmol/L — ABNORMAL LOW (ref 135–145)

## 2024-01-11 MED ORDER — ACETAMINOPHEN 500 MG PO TABS
1000.0000 mg | ORAL_TABLET | Freq: Four times a day (QID) | ORAL | Status: DC
  Administered 2024-01-11 – 2024-01-13 (×7): 1000 mg via ORAL
  Filled 2024-01-11 (×7): qty 2

## 2024-01-11 MED ORDER — VITAMIN D3 25 MCG PO TABS
1000.0000 [IU] | ORAL_TABLET | Freq: Every day | ORAL | 0 refills | Status: AC
  Filled 2024-01-11: qty 90, 90d supply, fill #0

## 2024-01-11 MED ORDER — OXYCODONE-ACETAMINOPHEN 5-325 MG PO TABS
1.0000 | ORAL_TABLET | ORAL | 0 refills | Status: AC | PRN
  Filled 2024-01-11: qty 42, 7d supply, fill #0

## 2024-01-11 MED ORDER — METHOCARBAMOL 500 MG PO TABS
500.0000 mg | ORAL_TABLET | Freq: Four times a day (QID) | ORAL | 0 refills | Status: AC | PRN
Start: 2024-01-11 — End: ?
  Filled 2024-01-11: qty 28, 7d supply, fill #0

## 2024-01-11 MED ORDER — VITAMIN D 25 MCG (1000 UNIT) PO TABS
1000.0000 [IU] | ORAL_TABLET | Freq: Every day | ORAL | Status: DC
  Administered 2024-01-11 – 2024-01-13 (×3): 1000 [IU] via ORAL
  Filled 2024-01-11 (×3): qty 1

## 2024-01-11 MED ORDER — ASPIRIN 81 MG PO TBEC
81.0000 mg | DELAYED_RELEASE_TABLET | Freq: Every day | ORAL | 0 refills | Status: AC
  Filled 2024-01-11: qty 30, 30d supply, fill #0

## 2024-01-11 NOTE — Plan of Care (Signed)
  Problem: Clinical Measurements: Goal: Ability to maintain clinical measurements within normal limits will improve Outcome: Progressing   Problem: Clinical Measurements: Goal: Will remain free from infection Outcome: Progressing   Problem: Clinical Measurements: Goal: Cardiovascular complication will be avoided Outcome: Progressing   Problem: Elimination: Goal: Will not experience complications related to urinary retention Outcome: Progressing   Problem: Pain Managment: Goal: General experience of comfort will improve and/or be controlled Outcome: Progressing

## 2024-01-11 NOTE — Progress Notes (Signed)
 Orthopaedic Trauma Progress Note  SUBJECTIVE: Doing ok today. Pain controlled at rest. Has not been out of bed since surgery. Denies numbness/tingling throughout the leg.  No chest pain. No SOB. No nausea/vomiting.  Tolerating diet and fluids.  No other complaints.   OBJECTIVE:  Vitals:   01/11/24 0423 01/11/24 0751  BP: 138/89 134/78  Pulse: 98 (!) 102  Resp: 16 18  Temp: 98.4 F (36.9 C) 98.1 F (36.7 C)  SpO2: 97% 100%    Opiates Today (MME): Today's  total administered Morphine Milligram Equivalents: 65 Opiates Yesterday (MME): Yesterday's total administered Morphine Milligram Equivalents: 172.5  General: Sitting up in bed, NAD Respiratory: No increased work of breathing.  Operative Extremity (RLE): Cam boot in place.  Dressing clean, dry, intact.  Endorses sensation light touch of the toes.  Soreness about the knee as expected.  Tolerates gentle knee range of motion.  Toes warm well-perfused  IMAGING: Stable post op imaging.   LABS:  Results for orders placed or performed during the hospital encounter of 01/09/24 (from the past 24 hours)  VITAMIN D 25 Hydroxy (Vit-D Deficiency, Fractures)     Status: Abnormal   Collection Time: 01/10/24  2:36 PM  Result Value Ref Range   Vit D, 25-Hydroxy 24.96 (L) 30 - 100 ng/mL  CBC     Status: None   Collection Time: 01/10/24  2:36 PM  Result Value Ref Range   WBC 6.4 4.0 - 10.5 K/uL   RBC 4.46 4.22 - 5.81 MIL/uL   Hemoglobin 14.5 13.0 - 17.0 g/dL   HCT 40.9 81.1 - 91.4 %   MCV 93.5 80.0 - 100.0 fL   MCH 32.5 26.0 - 34.0 pg   MCHC 34.8 30.0 - 36.0 g/dL   RDW 78.2 95.6 - 21.3 %   Platelets 160 150 - 400 K/uL   nRBC 0.0 0.0 - 0.2 %  Creatinine, serum     Status: None   Collection Time: 01/10/24  2:36 PM  Result Value Ref Range   Creatinine, Ser 0.87 0.61 - 1.24 mg/dL   GFR, Estimated >08 >65 mL/min  Basic metabolic panel     Status: Abnormal   Collection Time: 01/11/24  6:54 AM  Result Value Ref Range   Sodium 134 (L) 135 -  145 mmol/L   Potassium 4.1 3.5 - 5.1 mmol/L   Chloride 106 98 - 111 mmol/L   CO2 19 (L) 22 - 32 mmol/L   Glucose, Bld 170 (H) 70 - 99 mg/dL   BUN 19 6 - 20 mg/dL   Creatinine, Ser 7.84 0.61 - 1.24 mg/dL   Calcium 8.1 (L) 8.9 - 10.3 mg/dL   GFR, Estimated >69 >62 mL/min   Anion gap 9 5 - 15  CBC     Status: Abnormal   Collection Time: 01/11/24  6:54 AM  Result Value Ref Range   WBC 9.3 4.0 - 10.5 K/uL   RBC 3.94 (L) 4.22 - 5.81 MIL/uL   Hemoglobin 12.8 (L) 13.0 - 17.0 g/dL   HCT 95.2 (L) 84.1 - 32.4 %   MCV 92.6 80.0 - 100.0 fL   MCH 32.5 26.0 - 34.0 pg   MCHC 35.1 30.0 - 36.0 g/dL   RDW 40.1 02.7 - 25.3 %   Platelets 159 150 - 400 K/uL   nRBC 0.0 0.0 - 0.2 %    ASSESSMENT: Shawn Proctor is a 43 y.o. male, 1 Day Post-Op s/p fall Procedures: INTRAMEDULLARY NAIL RIGHT TIBIA FRACTURE  CV/Blood loss: Acute  blood loss anemia, Hgb 12.8 this AM. Hemodynamically stable  PLAN: Weightbearing: WBAT RLE in cam boot ROM: Knee and ankle motion as tolerated Incisional and dressing care: Reinforce dressings as needed  Showering: Okay to begin showering getting incisions wet 01/13/2024 Orthopedic device(s): CAM boot when OOB Pain management:  1. Tylenol  1000 mg q 6 hours scheduled 2. Robaxin 500 mg q 6 hours PRN 3. Oxycodone 5-15 mg q 4 hours PRN 4. Dilaudid 0.5-1 mg q 4 hours PRN VTE prophylaxis: Aspirin, SCDs ID:  Ancef 2gm post op Foley/Lines:  No foley, KVO IVFs Impediments to Fracture Healing: Vitamin D level 24, started on supplementation Dispo: PT/OT evaluated today.  Anticipate discharge home later today versus tomorrow pending pain control and mobility with therapies   D/C recommendations: - Oxycodone, Robaxin, Tylenol  for pain control - Aspirin for DVT prophylaxis - Continue 1000 units Vit D supplementation  Follow - up plan: 2 weeks after d/c for wound check and repeat x-rays   Contact information:  Katheryne Pane MD, Alona Jamaica PA-C. After hours and holidays please  check [email or web address] for group call information for [facility]   Edilia Gordon, PA-C [phone] (office) [email or web address]

## 2024-01-11 NOTE — TOC Progression Note (Addendum)
 Transition of Care (TOC) - Progression Note    PT : The potential need for Outpatient PT can be addressed at Ortho follow-up appointments.   Discussed with patient. Patient is staying with a friend at discharge in [place], he does not know address at present .   Orders for bedside commode, tub shower seat , and rolling walker. Patient in agreement. Ordered DME with Jermaine with [employer]  Patient Details  Name: Shorty Schoene MRN: [id number] Date of Birth: 03/07/1981  Transition of Care [facility]) CM/SW Contact  Chyane Greer, Arturo Late, RN Phone Number: 01/11/2024, 3:45 PM  Clinical Narrative:            Expected Discharge Plan and Services                                               Social Determinants of Health (SDOH) Interventions SDOH Screenings   Food Insecurity: Food Insecurity Present (01/09/2024)  Housing: Low Risk  (01/09/2024)  Transportation Needs: Unmet Transportation Needs (01/09/2024)  Utilities: Not At Risk (01/09/2024)  Depression (PHQ2-9): Low Risk  (07/06/2019)  Social Connections: Unknown (01/04/2022)   Received from [facility]  Stress: No Stress Concern Present (05/31/2023)   Received from [facility]  Tobacco Use: Low Risk  (01/10/2024)  Recent Concern: Tobacco Use - High Risk (12/01/2023)   Received from [facility]    Readmission Risk Interventions     No data to display

## 2024-01-11 NOTE — Progress Notes (Signed)
 Transition of Care [facility]) - CAGE-AID Screening   Patient Details  Name: Shawn Proctor MRN: [id number] Date of Birth: October 02, 1980   Asa Bjork, RN Trauma Response Nurse Phone Number: [phone] 01/11/2024, 5:42 PM    CAGE-AID Screening:    Have You Ever Felt You Ought to Cut Down on Your Drinking or Drug Use?: No Have People Annoyed You By Critizing Your Drinking Or Drug Use?: No Have You Felt Bad Or Guilty About Your Drinking Or Drug Use?: No Have You Ever Had a Drink or Used Drugs First Thing In The Morning to Steady Your Nerves or to Get Rid of a Hangover?: No CAGE-AID Score: 0  Substance Abuse Education Offered: (S) No (Denies substance or alcohol use)

## 2024-01-11 NOTE — Progress Notes (Signed)
 Orthopedic Tech Progress Note Patient Details:  Shawn Proctor Jun 12, 1981 [id number]  Ortho Devices Type of Ortho Device: Crutches Ortho Device/Splint Location: RLE Ortho Device/Splint Interventions: Adjustment, Ordered   Post Interventions Patient Tolerated: Well  Rhona Fusilier A Azelea Seguin 01/11/2024, 3:35 PM

## 2024-01-11 NOTE — Discharge Summary (Signed)
 Orthopaedic Trauma Service (OTS) Discharge Summary   Patient ID: Shawn Proctor MRN: [id number] DOB/AGE: 1981/01/05 43 y.o.  Admit date: 01/09/2024 Discharge date: 01/13/2024  Admission Diagnoses: Right tibia fracture  Discharge Diagnoses:  Principal Problem:   Closed tibia fracture Active Problems:   Closed fracture of right distal tibia   Past Medical History:  Diagnosis Date   HIV infection (HCC)      Procedures Performed: Intramedullary Nail right tibia  Discharged Condition: stable  Hospital Course: Patient presented to [facility] emergency department on 01/09/2024 after a fall at home.  Imaging emergency department revealed right tibia fracture.  Orthopedics consulted for evaluation and management.  Due to surgeon in OR availability, Dr. Hiram Lukes asked OTS to assume care of patient.  Patient subsequently placed in a short leg splint by EDP and transferred to [facility] for surgical intervention to the right lower extremity.  Patient admitted to the orthopedic service upon arriving to [facility].  Patient is operating room by Dr. Curtiss Dowdy on 01/10/2024 for the above procedure.  He tolerated this without complications.  Was allowed to be weightbearing as tolerated on the right lower extremity postoperatively in a cam boot.  Patient was allowed unrestricted knee and ankle range of motion.  Patient began working with physical and Occupational Therapy starting on postoperative day #1.  He was started on Lovenox for DVT prophylaxis starting on postoperative day #1.  The remainder of patient's hospitalization was dedicated to achieving adequate pain control and increasing mobility in order for patient to safely return home.  On 01/13/2024, the patient was tolerating diet, working well with therapies, pain well controlled, vital signs stable, dressings clean, dry, intact and felt stable for discharge to home. Patient will follow up as below and knows to call with questions or concerns.      Consults: None  Significant Diagnostic Studies:   Results for orders placed or performed during the hospital encounter of 01/09/24 (from the past week)  Basic metabolic panel   Collection Time: 01/09/24  6:56 PM  Result Value Ref Range   Sodium 141 135 - 145 mmol/L   Potassium 3.8 3.5 - 5.1 mmol/L   Chloride 106 98 - 111 mmol/L   CO2 22 22 - 32 mmol/L   Glucose, Bld 90 70 - 99 mg/dL   BUN 16 6 - 20 mg/dL   Creatinine, Ser 4.09 0.61 - 1.24 mg/dL   Calcium 9.0 8.9 - 81.1 mg/dL   GFR, Estimated >91 >47 mL/min   Anion gap 13 5 - 15  CBC with Differential   Collection Time: 01/09/24  6:56 PM  Result Value Ref Range   WBC 9.9 4.0 - 10.5 K/uL   RBC 4.62 4.22 - 5.81 MIL/uL   Hemoglobin 14.8 13.0 - 17.0 g/dL   HCT 82.9 56.2 - 13.0 %   MCV 94.8 80.0 - 100.0 fL   MCH 32.0 26.0 - 34.0 pg   MCHC 33.8 30.0 - 36.0 g/dL   RDW 86.5 78.4 - 69.6 %   Platelets 202 150 - 400 K/uL   nRBC 0.0 0.0 - 0.2 %   Neutrophils Relative % 68 %   Neutro Abs 6.7 1.7 - 7.7 K/uL   Lymphocytes Relative 23 %   Lymphs Abs 2.3 0.7 - 4.0 K/uL   Monocytes Relative 9 %   Monocytes Absolute 0.9 0.1 - 1.0 K/uL   Eosinophils Relative 0 %   Eosinophils Absolute 0.0 0.0 - 0.5 K/uL   Basophils Relative 0 %  Basophils Absolute 0.0 0.0 - 0.1 K/uL   Immature Granulocytes 0 %   Abs Immature Granulocytes 0.01 0.00 - 0.07 K/uL  Surgical PCR screen   Collection Time: 01/10/24 12:34 AM   Specimen: Nasal Mucosa; Nasal Swab  Result Value Ref Range   MRSA, PCR POSITIVE (A) NEGATIVE   Staphylococcus aureus POSITIVE (A) NEGATIVE  VITAMIN D 25 Hydroxy (Vit-D Deficiency, Fractures)   Collection Time: 01/10/24  2:36 PM  Result Value Ref Range   Vit D, 25-Hydroxy 24.96 (L) 30 - 100 ng/mL  CBC   Collection Time: 01/10/24  2:36 PM  Result Value Ref Range   WBC 6.4 4.0 - 10.5 K/uL   RBC 4.46 4.22 - 5.81 MIL/uL   Hemoglobin 14.5 13.0 - 17.0 g/dL   HCT 16.1 09.6 - 04.5 %   MCV 93.5 80.0 - 100.0 fL   MCH 32.5 26.0 - 34.0  pg   MCHC 34.8 30.0 - 36.0 g/dL   RDW 40.9 81.1 - 91.4 %   Platelets 160 150 - 400 K/uL   nRBC 0.0 0.0 - 0.2 %  Creatinine, serum   Collection Time: 01/10/24  2:36 PM  Result Value Ref Range   Creatinine, Ser 0.87 0.61 - 1.24 mg/dL   GFR, Estimated >78 >29 mL/min  Basic metabolic panel   Collection Time: 01/11/24  6:54 AM  Result Value Ref Range   Sodium 134 (L) 135 - 145 mmol/L   Potassium 4.1 3.5 - 5.1 mmol/L   Chloride 106 98 - 111 mmol/L   CO2 19 (L) 22 - 32 mmol/L   Glucose, Bld 170 (H) 70 - 99 mg/dL   BUN 19 6 - 20 mg/dL   Creatinine, Ser 5.62 0.61 - 1.24 mg/dL   Calcium 8.1 (L) 8.9 - 10.3 mg/dL   GFR, Estimated >13 >08 mL/min   Anion gap 9 5 - 15  CBC   Collection Time: 01/11/24  6:54 AM  Result Value Ref Range   WBC 9.3 4.0 - 10.5 K/uL   RBC 3.94 (L) 4.22 - 5.81 MIL/uL   Hemoglobin 12.8 (L) 13.0 - 17.0 g/dL   HCT 65.7 (L) 84.6 - 96.2 %   MCV 92.6 80.0 - 100.0 fL   MCH 32.5 26.0 - 34.0 pg   MCHC 35.1 30.0 - 36.0 g/dL   RDW 95.2 84.1 - 32.4 %   Platelets 159 150 - 400 K/uL   nRBC 0.0 0.0 - 0.2 %  CBC   Collection Time: 01/12/24  7:08 AM  Result Value Ref Range   WBC 6.4 4.0 - 10.5 K/uL   RBC 4.13 (L) 4.22 - 5.81 MIL/uL   Hemoglobin 13.3 13.0 - 17.0 g/dL   HCT 40.1 (L) 02.7 - 25.3 %   MCV 93.5 80.0 - 100.0 fL   MCH 32.2 26.0 - 34.0 pg   MCHC 34.5 30.0 - 36.0 g/dL   RDW 66.4 40.3 - 47.4 %   Platelets 158 150 - 400 K/uL   nRBC 0.0 0.0 - 0.2 %     Treatments: IV hydration, antibiotics: Ancef, analgesia: acetaminophen , Dilaudid, oxycodone and Toradol, anticoagulation: LMW heparin, therapies: PT and OT, and surgery: As above  Discharge Exam: General: Patient sitting up in bed, no acute distress. Respiratory: No increased work of breathing at rest Right lower extremity: Cam boot in place.  Dressing clean, dry, intact.  Tenderness about the knee as expected.  Lower leg/calf swollen but compartments compressible.  Tolerates very gentle ankle range of motion.   Able  to wiggle toes. Toes warm well-perfused. + DP pulse   Disposition: Discharge disposition: 01-Home or Self Care       Discharge Instructions     Call MD / Call 911   Complete by: As directed    If you experience chest pain or shortness of breath, CALL 911 and be transported to the hospital emergency room.  If you develope a fever above 101 F, pus (white drainage) or increased drainage or redness at the wound, or calf pain, call your surgeon's office.   Constipation Prevention   Complete by: As directed    Drink plenty of fluids.  Prune juice may be helpful.  You may use a stool softener, such as Colace (over the counter) 100 mg twice a day.  Use MiraLax (over the counter) for constipation as needed.   Diet - low sodium heart healthy   Complete by: As directed    Increase activity slowly as tolerated   Complete by: As directed    Post-operative opioid taper instructions:   Complete by: As directed    POST-OPERATIVE OPIOID TAPER INSTRUCTIONS: It is important to wean off of your opioid medication as soon as possible. If you do not need pain medication after your surgery it is ok to stop day one. Opioids include: Codeine, Hydrocodone(Norco, Vicodin), Oxycodone(Percocet, oxycontin) and hydromorphone amongst others.  Long term and even short term use of opiods can cause: Increased pain response Dependence Constipation Depression Respiratory depression And more.  Withdrawal symptoms can include Flu like symptoms Nausea, vomiting And more Techniques to manage these symptoms Hydrate well Eat regular healthy meals Stay active Use relaxation techniques(deep breathing, meditating, yoga) Do Not substitute Alcohol to help with tapering If you have been on opioids for less than two weeks and do not have pain than it is ok to stop all together.  Plan to wean off of opioids This plan should start within one week post op of your joint replacement. Maintain the same interval or time  between taking each dose and first decrease the dose.  Cut the total daily intake of opioids by one tablet each day Next start to increase the time between doses. The last dose that should be eliminated is the evening dose.         Allergies as of 01/13/2024       Reactions   Clindamycin/lincomycin         Medication List     STOP taking these medications    zolpidem 5 MG tablet Commonly known as: AMBIEN       TAKE these medications    acetaminophen  500 MG tablet Commonly known as: TYLENOL  Take 2 tablets (1,000 mg total) by mouth every 6 (six) hours as needed for mild pain (pain score 1-3), fever, headache or moderate pain (pain score 4-6).   aspirin EC 81 MG tablet Take 1 tablet (81 mg total) by mouth daily. Swallow whole. What changed:  when to take this reasons to take this   Biktarvy  50-200-25 MG Tabs tablet Generic drug: bictegravir-emtricitabine-tenofovir AF Take 1 tablet by mouth daily.   chlorhexidine 4 % external liquid Commonly known as: HIBICLENS Apply 15 mLs (1 Application total) topically as directed for 30 doses. Use as directed daily for 5 days every other week for 6 weeks.   methocarbamol 500 MG tablet Commonly known as: ROBAXIN Take 1 tablet (500 mg total) by mouth every 6 (six) hours as needed for muscle spasms.   mupirocin ointment 2 % Commonly known  as: BACTROBAN Place 1 Application into the nose 2 (two) times daily for 60 doses. Use as directed 2 times daily for 5 days every other week for 6 weeks.   oxyCODONE-acetaminophen  5-325 MG tablet Commonly known as: Percocet Take 1 tablet by mouth every 4 (four) hours as needed for severe pain (pain score 7-10).   valACYclovir  1000 MG tablet Commonly known as: VALTREX  TAKE 1 TABLET(1000 MG) BY MOUTH TWICE DAILY   vitamin D3 25 MCG tablet Commonly known as: CHOLECALCIFEROL Take 1 tablet (1,000 Units total) by mouth daily.               Durable Medical Equipment  (From admission,  onward)           Start     Ordered   01/11/24 1548  For home use only DME Shower stool  Once        01/11/24 1548   01/11/24 1449  For home use only DME Walker rolling  Once       Question Answer Comment  Walker: With 5 Inch Wheels   Patient needs a walker to treat with the following condition Right tibial fracture      01/11/24 1451   01/11/24 1448  For home use only DME Bedside commode  Once       Question:  Patient needs a bedside commode to treat with the following condition  Answer:  Right tibial fracture   01/11/24 1451            Follow-up Information     Haddix, Florentina Huntsman, MD. Schedule an appointment as soon as possible for a visit in 2 week(s).   Specialty: Orthopedic Surgery Why: for wound check and repeat x-rays Contact information: [address] [place] [address] [phone]                 Discharge Instructions and Plan: Patient will be discharged to home. Will be discharged on Aspirin for DVT prophylaxis. Patient has been provided with all the necessary DME for discharge. Patient will follow up with Dr. Curtiss Dowdy in 2 weeks for repeat x-rays and wound check.   Signed:  Edilia Gordon, PA-C ?[phone]? (phone) 01/13/2024, 7:53 AM  [facility] [address] [place] [address] [phone] (F)

## 2024-01-11 NOTE — Plan of Care (Signed)

## 2024-01-11 NOTE — Evaluation (Signed)
 Occupational Therapy Evaluation Patient Details Name: Shawn Proctor MRN: [id number] DOB: [birth year] Today's Date: 01/11/2024   History of Present Illness   Pt is a 43 y/o male presenting on 5/11 after tripping and rolling his R ankle. Found with R tibia fx, 5/12 s/p ORIF R tibial shaft. Post OP WBAT in walking boot. PMH includes HIV.     Clinical Impressions PTA patient independent. Admitted for above and presents with problem list below.  Pt mobilizing well, up with PT upon entry and using RW with min guard for mobility into bathroom.  Stood at sink with min guard to complete grooming.  Limited by pain, tolerance and balance.  Reviewed LB ADL compensatory techniques, pt requiring min assist overall. Anticipate he will progress well with no further needs required after dc.  Will follow acutely to optimize independence.       If plan is discharge home, recommend the following:   A little help with walking and/or transfers;A little help with bathing/dressing/bathroom;Assistance with cooking/housework;Help with stairs or ramp for entrance;Assist for transportation     Functional Status Assessment   Patient has had a recent decline in their functional status and demonstrates the ability to make significant improvements in function in a reasonable and predictable amount of time.     Equipment Recommendations   Tub/shower seat     Recommendations for Other Services         Precautions/Restrictions   Precautions Precautions: Fall Recall of Precautions/Restrictions: Intact Required Braces or Orthoses: Other Brace Other Brace: CAM walker Restrictions Weight Bearing Restrictions Per Provider Order: Yes RLE Weight Bearing Per Provider Order: Weight bearing as tolerated Other Position/Activity Restrictions: CAM walker     Mobility Bed Mobility               General bed mobility comments: OOB with PT upon entry    Transfers                   General  transfer comment: pt standing with PT upon entry, PT reports contact guard for transfer from EOB      Balance Overall balance assessment: Needs assistance Sitting-balance support: No upper extremity supported, Feet supported Sitting balance-Leahy Scale: Fair     Standing balance support: Bilateral upper extremity supported, No upper extremity supported, During functional activity Standing balance-Leahy Scale: Fair Standing balance comment: relies on BUE support dynamically, contact guard with 0 hand support at sink during ADLs                           ADL either performed or assessed with clinical judgement   ADL Overall ADL's : Needs assistance/impaired     Grooming: Contact guard assist;Standing;Wash/dry hands;Wash/dry face           Upper Body Dressing : Set up;Sitting   Lower Body Dressing: Sit to/from stand;Minimal assistance Lower Body Dressing Details (indicate cue type and reason): pt shaky without BUE support but no LOB, reviewed compensatory techniques for dressing Toilet Transfer: Ambulation;Rolling walker (2 wheels);Contact guard assist           Functional mobility during ADLs: Contact guard assist;Rolling walker (2 wheels)       Vision   Vision Assessment?: No apparent visual deficits     Perception         Praxis         Pertinent Vitals/Pain Pain Assessment Pain Assessment: Faces Faces Pain Scale: Hurts little more Pain Location: R LE  Pain Descriptors / Indicators: Discomfort, Operative site guarding Pain Intervention(s): Limited activity within patient's tolerance, Monitored during session, Repositioned     Extremity/Trunk Assessment Upper Extremity Assessment Upper Extremity Assessment: Overall WFL for tasks assessed   Lower Extremity Assessment Lower Extremity Assessment: Defer to PT evaluation       Communication Communication Communication: No apparent difficulties   Cognition Arousal: Alert Behavior During  Therapy: WFL for tasks assessed/performed Cognition: No apparent impairments             OT - Cognition Comments: pt fearful of pain and falling but cognition is [facility]                 Following commands: Intact       Cueing  General Comments   Cueing Techniques: Verbal cues      Exercises     Shoulder Instructions      Home Living Family/patient expects to be discharged to:: Unsure                                 Additional Comments: pt reports alternating between his moms home in [place] and his friends home in [place].  Uses train to travel back and forth weekly.  Tub shower in [place], walk in shower in [place].      Prior Functioning/Environment Prior Level of Function : Independent/Modified Independent                    OT Problem List: Decreased strength;Decreased activity tolerance;Impaired balance (sitting and/or standing);Pain;Decreased knowledge of precautions;Decreased knowledge of use of DME or AE   OT Treatment/Interventions: Self-care/ADL training;Therapeutic exercise;DME and/or AE instruction;Therapeutic activities;Patient/family education;Balance training      OT Goals(Current goals can be found in the care plan section)   Acute Rehab OT Goals Patient Stated Goal: get better OT Goal Formulation: With patient Time For Goal Achievement: 01/25/24 Potential to Achieve Goals: Good   OT Frequency:  Min 2X/week    Co-evaluation              AM-PAC OT "6 Clicks" Daily Activity     Outcome Measure Help from another person eating meals?: None Help from another person taking care of personal grooming?: A Little Help from another person toileting, which includes using toliet, bedpan, or urinal?: A Little Help from another person bathing (including washing, rinsing, drying)?: A Little Help from another person to put on and taking off regular upper body clothing?: A Little Help from another person to put on and  taking off regular lower body clothing?: A Little 6 Click Score: 19   End of Session Equipment Utilized During Treatment: Gait belt;Rolling walker (2 wheels) Nurse Communication: Mobility status  Activity Tolerance: Patient tolerated treatment well Patient left: in chair;with call bell/phone within reach;with chair alarm set  OT Visit Diagnosis: Other abnormalities of gait and mobility (R26.89);Muscle weakness (generalized) (M62.81);Pain Pain - Right/Left: Right Pain - part of body: Leg                Time: 3244-0102 OT Time Calculation (min): 14 min Charges:  OT General Charges $OT Visit: 1 Visit OT Evaluation $OT Eval Low Complexity: 1 Low  Bary Boss, OT Acute Rehabilitation Services Office [phone] Secure Chat Preferred    Fredrich Jefferson 01/11/2024, 1:19 PM

## 2024-01-11 NOTE — Evaluation (Signed)
 Physical Therapy Evaluation Patient Details Name: Shawn Proctor MRN: [id number] DOB: 24-Oct-1980 Today's Date: 01/11/2024  History of Present Illness  Pt is a 42 y/o male presenting on 5/11 after tripping and rolling his R ankle. Found with R tibia fx, 5/12 s/p ORIF R tibial shaft. Post OP WBAT in walking boot. PMH includes HIV.  Clinical Impression  Pt admitted with above diagnosis. Lives at home with a friend in [place], and visits his mother in [place] weekly, in a single-level home with many steps to enter; Prior to admission, pt was independent; Presents to PT with decr activity tolerance, RLE pain, decr   amb distance;  [place] CGA fo rbed mobility, sit to stand, and to walk short distances with RW; Pt currently with functional limitations due to the deficits listed below (see PT Problem List). Pt will benefit from skilled PT to increase their independence and safety with mobility to allow discharge to the venue listed below.       The potential need for Outpatient PT can be addressed at Ortho follow-up appointments.       If plan is discharge home, recommend the following: A little help with walking and/or transfers;A little help with bathing/dressing/bathroom   Can travel by private vehicle        Equipment Recommendations Rolling walker (2 wheels);BSC/3in1;Crutches  Recommendations for Other Services       Functional Status Assessment Patient has had a recent decline in their functional status and demonstrates the ability to make significant improvements in function in a reasonable and predictable amount of time.     Precautions / Restrictions Precautions Precautions: Fall Recall of Precautions/Restrictions: Intact Required Braces or Orthoses: Other Brace Other Brace: CAM walker Restrictions Weight Bearing Restrictions Per Provider Order: Yes RLE Weight Bearing Per Provider Order: Weight bearing as tolerated Other Position/Activity Restrictions: CAM walker       Mobility  Bed Mobility Overal bed mobility: Needs Assistance Bed Mobility: Supine to Sit     Supine to sit: Supervision     General bed mobility comments: Used bed rails, but moving well    Transfers Overall transfer level: Needs assistance Equipment used: Rolling walker (2 wheels) Transfers: Sit to/from Stand Sit to Stand: Contact guard assist           General transfer comment: Cues for hand placement and safety    Ambulation/Gait Ambulation/Gait assistance: Contact guard assist Gait Distance (Feet): 25 Feet Assistive device: Rolling walker (2 wheels) Gait Pattern/deviations: Step-to pattern       General Gait Details: Cues for RW use to offload painful RLE in stance  Stairs            Wheelchair Mobility     Tilt Bed    Modified Rankin (Stroke Patients Only)       Balance Overall balance assessment: Needs assistance Sitting-balance support: No upper extremity supported, Feet supported Sitting balance-Leahy Scale: Fair     Standing balance support: Bilateral upper extremity supported, No upper extremity supported, During functional activity Standing balance-Leahy Scale: Fair Standing balance comment: relies on BUE support dynamically, contact guard with 0 hand support at sink during ADLs                             Pertinent Vitals/Pain Pain Assessment Pain Assessment: Faces Faces Pain Scale: Hurts little more Pain Location: R LE Pain Descriptors / Indicators: Discomfort, Operative site guarding Pain Intervention(s): Limited activity within patient's tolerance, Monitored during  session, Patient requesting pain meds-RN notified, Premedicated before session    Home Living Family/patient expects to be discharged to:: Unsure                   Additional Comments: pt reports alternating between his moms home in [place] and his friends home in [place].  Uses train to travel back and forth weekly.  Tub shower in [place],  walk in shower in [place].    Prior Function Prior Level of Function : Independent/Modified Independent                     Extremity/Trunk Assessment   Upper Extremity Assessment Upper Extremity Assessment: Defer to OT evaluation    Lower Extremity Assessment Lower Extremity Assessment: RLE deficits/detail RLE Deficits / Details: R lower leg in cam boot; adequate ROM and strenght in hip/knee for functionality       Communication   Communication Communication: No apparent difficulties    Cognition Arousal: Alert Behavior During Therapy: WFL for tasks assessed/performed                             Following commands: Intact       Cueing Cueing Techniques: Verbal cues     General Comments General comments (skin integrity, edema, etc.): Nervous about getting up, but moving well    Exercises     Assessment/Plan    PT Assessment Patient needs continued PT services  PT Problem List Decreased strength;Decreased range of motion;Decreased activity tolerance;Decreased balance;Decreased mobility;Decreased knowledge of use of DME;Decreased knowledge of precautions;Pain       PT Treatment Interventions DME instruction;Gait training;Stair training;Functional mobility training;Therapeutic activities;Therapeutic exercise;Balance training;Patient/family education;Wheelchair mobility training;Manual techniques    PT Goals (Current goals can be found in the Care Plan section)  Acute Rehab PT Goals Patient Stated Goal: be able to manage at home (between [facility] and [place]) PT Goal Formulation: With patient Time For Goal Achievement: 01/25/24 Potential to Achieve Goals: Good    Frequency Min 5X/week     Co-evaluation               AM-PAC PT "6 Clicks" Mobility  Outcome Measure Help needed turning from your back to your side while in a flat bed without using bedrails?: None Help needed moving from lying on your back to sitting on the side of a flat  bed without using bedrails?: None Help needed moving to and from a bed to a chair (including a wheelchair)?: A Little Help needed standing up from a chair using your arms (e.g., wheelchair or bedside chair)?: A Little Help needed to walk in hospital room?: A Little Help needed climbing 3-5 steps with a railing? : A Little 6 Click Score: 20    End of Session Equipment Utilized During Treatment: Gait belt;Other (comment) (cam boot) Activity Tolerance: Patient tolerated treatment well Patient left: in chair;with call bell/phone within reach;with chair alarm set Nurse Communication: Mobility status PT Visit Diagnosis: Unsteadiness on feet (R26.81);Other abnormalities of gait and mobility (R26.89)    Time: 1042-1100 PT Time Calculation (min) (ACUTE ONLY): 18 min   Charges:   PT Evaluation $PT Eval Moderate Complexity: 1 Mod   PT General Charges $$ ACUTE PT VISIT: 1 Visit         Darcus Eastern, PT  Acute Rehabilitation Services Office [phone] Secure Chat welcomed   Marcial Setting 01/11/2024, 2:30 PM

## 2024-01-12 LAB — CBC
HCT: 38.6 % — ABNORMAL LOW (ref 39.0–52.0)
Hemoglobin: 13.3 g/dL (ref 13.0–17.0)
MCH: 32.2 pg (ref 26.0–34.0)
MCHC: 34.5 g/dL (ref 30.0–36.0)
MCV: 93.5 fL (ref 80.0–100.0)
Platelets: 158 10*3/uL (ref 150–400)
RBC: 4.13 MIL/uL — ABNORMAL LOW (ref 4.22–5.81)
RDW: 13.4 % (ref 11.5–15.5)
WBC: 6.4 10*3/uL (ref 4.0–10.5)
nRBC: 0 % (ref 0.0–0.2)

## 2024-01-12 MED ORDER — KETOROLAC TROMETHAMINE 15 MG/ML IJ SOLN
30.0000 mg | Freq: Four times a day (QID) | INTRAMUSCULAR | Status: DC
  Administered 2024-01-12 – 2024-01-13 (×3): 30 mg via INTRAVENOUS
  Filled 2024-01-12 (×3): qty 2

## 2024-01-12 MED ORDER — HYDROMORPHONE HCL 1 MG/ML IJ SOLN
1.0000 mg | INTRAMUSCULAR | Status: DC | PRN
  Administered 2024-01-12 – 2024-01-13 (×3): 1 mg via INTRAVENOUS
  Filled 2024-01-12 (×3): qty 1

## 2024-01-12 MED ORDER — METHOCARBAMOL 1000 MG/10ML IJ SOLN
500.0000 mg | Freq: Four times a day (QID) | INTRAMUSCULAR | Status: DC
  Filled 2024-01-12: qty 10

## 2024-01-12 MED ORDER — METHOCARBAMOL 750 MG PO TABS
750.0000 mg | ORAL_TABLET | Freq: Four times a day (QID) | ORAL | Status: DC
  Administered 2024-01-12 – 2024-01-13 (×3): 750 mg via ORAL
  Filled 2024-01-12 (×3): qty 1

## 2024-01-12 MED ORDER — KETOROLAC TROMETHAMINE 15 MG/ML IJ SOLN
15.0000 mg | Freq: Four times a day (QID) | INTRAMUSCULAR | Status: DC
  Administered 2024-01-12: 15 mg via INTRAVENOUS
  Filled 2024-01-12: qty 1

## 2024-01-12 NOTE — Progress Notes (Signed)
 Physical Therapy Treatment Patient Details Name: Shawn Proctor MRN: [id number] DOB: 18-May-1981 Today's Date: 01/12/2024   History of Present Illness Pt is a 43 y/o male presenting on 5/11 after tripping and rolling his R ankle. Found with R tibia fx, 5/12 s/p ORIF R tibial shaft. Post OP WBAT in walking boot. PMH includes HIV.    PT Comments  Continuing work on functional mobility and activity tolerance; session focused on gait training with crutches; overall, Tyreke performed gait trials in his room, going back-and-forth between the bed and the window pretty well; he mostly maintained non-weight-bearing of his right lower extremity, but tried touching down just a few times; Cesc can get so worried with the anticipation of pain; at the end of the session, he stated yes the pain is there, but not as bad as he anticipated; next PT session, will need to cover further distance with gait training to discern if rolling walker, or crutches, or both are the best options for him; he will also need to cover stair training in prep for leaving the hospital    If plan is discharge home, recommend the following: A little help with walking and/or transfers;A little help with bathing/dressing/bathroom   Can travel by private vehicle        Equipment Recommendations  Rolling walker (2 wheels);BSC/3in1;Crutches    Recommendations for Other Services       Precautions / Restrictions Precautions Precautions: Fall Recall of Precautions/Restrictions: Intact Required Braces or Orthoses: Other Brace Other Brace: CAM walker Restrictions RLE Weight Bearing Per Provider Order: Weight bearing as tolerated Other Position/Activity Restrictions: CAM walker     Mobility  Bed Mobility Overal bed mobility: Needs Assistance Bed Mobility: Supine to Sit     Supine to sit: Supervision     General bed mobility comments: Used bed rails, but moving well    Transfers Overall transfer level: Needs  assistance Equipment used: Crutches Transfers: Sit to/from Stand Sit to Stand: Contact guard assist           General transfer comment: Cues for hand placement and safety    Ambulation/Gait Ambulation/Gait assistance: Contact guard assist Gait Distance (Feet): 24 Feet (4 bouts of 6 feet) Assistive device: Crutches Gait Pattern/deviations: Step-to pattern, Step-through pattern       General Gait Details: step through pattern while maintaining NWB; step to pattern while trying to see about touching RLE down   Stairs             Wheelchair Mobility     Tilt Bed    Modified Rankin (Stroke Patients Only)       Balance     Sitting balance-Leahy Scale: Fair       Standing balance-Leahy Scale: Fair                              Hotel manager: No apparent difficulties  Cognition Arousal: Alert Behavior During Therapy: WFL for tasks assessed/performed                           PT - Cognition Comments: anxious with anticipation of pain Following commands: Intact      Cueing Cueing Techniques: Verbal cues, Visual cues  Exercises      General Comments General comments (skin integrity, edema, etc.): Likely for dc tomorrow      Pertinent Vitals/Pain Pain Assessment Pain Assessment: Faces Faces Pain Scale: Hurts little  more Pain Location: R LE Pain Descriptors / Indicators: Discomfort, Operative site guarding Pain Intervention(s): Limited activity within patient's tolerance, RN gave pain meds during session    Home Living                          Prior Function            PT Goals (current goals can now be found in the care plan section) Acute Rehab PT Goals Patient Stated Goal: be able to manage at home (between [facility] and [place]) PT Goal Formulation: With patient Time For Goal Achievement: 01/25/24 Potential to Achieve Goals: Good Progress towards PT goals: Progressing toward  goals    Frequency    Min 5X/week      PT Plan      Co-evaluation              AM-PAC PT "6 Clicks" Mobility   Outcome Measure  Help needed turning from your back to your side while in a flat bed without using bedrails?: None Help needed moving from lying on your back to sitting on the side of a flat bed without using bedrails?: None Help needed moving to and from a bed to a chair (including a wheelchair)?: A Little Help needed standing up from a chair using your arms (e.g., wheelchair or bedside chair)?: A Little Help needed to walk in hospital room?: A Little Help needed climbing 3-5 steps with a railing? : A Little 6 Click Score: 20    End of Session Equipment Utilized During Treatment: Gait belt Activity Tolerance: Patient tolerated treatment well Patient left: in bed;with call bell/phone within reach Nurse Communication: Mobility status PT Visit Diagnosis: Unsteadiness on feet (R26.81);Other abnormalities of gait and mobility (R26.89)     Time: 1610-9604 PT Time Calculation (min) (ACUTE ONLY): 17 min  Charges:    $Gait Training: 8-22 mins PT General Charges $$ ACUTE PT VISIT: 1 Visit                     Darcus Eastern, PT  Acute Rehabilitation Services Office [phone] Secure Chat welcomed    Marcial Setting 01/12/2024, 5:09 PM

## 2024-01-12 NOTE — Progress Notes (Signed)
 Explained SCDs to pt.  Pt refuses SCDs, states they are uncomfortable and does not want them.  Pt stated he walks to bathroom and gets the shot so he doesn't need them.

## 2024-01-12 NOTE — Progress Notes (Signed)
 Pt DME has been deliver to th ept room.

## 2024-01-12 NOTE — Plan of Care (Signed)
  Problem: Clinical Measurements: Goal: Will remain free from infection Outcome: Progressing Goal: Respiratory complications will improve Outcome: Progressing   Problem: Activity: Goal: Risk for activity intolerance will decrease Outcome: Progressing   Problem: Nutrition: Goal: Adequate nutrition will be maintained Outcome: Progressing   Problem: Pain Managment: Goal: General experience of comfort will improve and/or be controlled Outcome: Not Progressing

## 2024-01-12 NOTE — Progress Notes (Signed)
 Orthopaedic Trauma Progress Note  SUBJECTIVE: Doing fair today.  Notes significant increase in pain compared to yesterday.  Thinks that he might of overdone it with therapies.  Requiring IV Dilaudid 4-5 times a day.  Will add IV Toradol today to hopefully reduce need for stronger medications. Denies numbness/tingling throughout the leg.  Notes therapies went decent yesterday but were limited by pain.  I did discuss with patient that while we do allow him to be weightbearing as tolerated postoperatively, if pain is too severe he should scale back on the amount of weight he is putting to the right leg.  He states his understanding of this.  No chest pain. No SOB. No nausea/vomiting.  Tolerating diet and fluids.  No other complaints.  Prior to admission, patient was living with a friend here in [place].  He is confident that he will be able to stay with a friend at discharge.  OBJECTIVE:  Vitals:   01/11/24 2114 01/12/24 0758  BP: 132/78 123/77  Pulse: 91 74  Resp: 16 16  Temp: 97.8 F (36.6 C) 98.8 F (37.1 C)  SpO2: 99% 100%    Opiates Today (MME): Today's  total administered Morphine Milligram Equivalents: 35 Opiates Yesterday (MME): Yesterday's total administered Morphine Milligram Equivalents: 162.5  General: Sitting up in bed, NAD Respiratory: No increased work of breathing.  Operative Extremity (RLE): Cam boot in place, removed for exam.  Dressing clean, dry, intact.  Left in place per patient's request endorses sensation to light touch of the toes.  Soreness about the knee as expected.  Tolerates gentle ankle range of motion.  Swelling to the lower leg as expected but compartments compressible.  Toes warm well-perfused. + DP pulse  IMAGING: Stable post op imaging.   LABS:  Results for orders placed or performed during the hospital encounter of 01/09/24 (from the past 24 hours)  CBC     Status: Abnormal   Collection Time: 01/12/24  7:08 AM  Result Value Ref Range   WBC 6.4 4.0 -  10.5 K/uL   RBC 4.13 (L) 4.22 - 5.81 MIL/uL   Hemoglobin 13.3 13.0 - 17.0 g/dL   HCT 40.9 (L) 81.1 - 91.4 %   MCV 93.5 80.0 - 100.0 fL   MCH 32.2 26.0 - 34.0 pg   MCHC 34.5 30.0 - 36.0 g/dL   RDW 78.2 95.6 - 21.3 %   Platelets 158 150 - 400 K/uL   nRBC 0.0 0.0 - 0.2 %    ASSESSMENT: Shawn Proctor is a 42 y.o. male, 2 Days Post-Op s/p fall Procedures: INTRAMEDULLARY NAIL RIGHT TIBIA FRACTURE  CV/Blood loss: Hemoglobin 13.3 this morning, trending upward.  Hemodynamically stable  PLAN: Weightbearing: WBAT RLE in cam boot ROM: Knee and ankle motion as tolerated Incisional and dressing care: Daily dressing changes as needed starting today Showering: Okay to begin showering/getting incisions wet 01/13/2024 Orthopedic device(s): CAM boot when OOB Pain management:  1. Tylenol  1000 mg q 6 hours scheduled 2. Robaxin 500 mg q 6 hours PRN 3. Oxycodone 5-15 mg q 4 hours PRN 4. Dilaudid 0.5-1 mg q 4 hours PRN 5. Toradol 15 mg q 6 hours x 5 doses VTE prophylaxis: Aspirin, SCDs ID:  Ancef 2gm post op completed Foley/Lines:  No foley, KVO IVFs Impediments to Fracture Healing: Vitamin D level 24, started on supplementation Dispo: PT/OT evaluation ongoing.  Patient may benefit from outpatient PT at discharge.  We will continue to work on pain control today, hopefully discharge tomorrow 01/13/2024 if pain  controlled and mobilizing well with therapies.  D/C recommendations: - Oxycodone, Robaxin, Tylenol  for pain control - Aspirin for DVT prophylaxis - Continue 1000 units Vit D supplementation  Follow - up plan: 2 weeks after d/c for wound check and repeat x-rays   Contact information:  Katheryne Pane MD, Alona Jamaica PA-C. After hours and holidays please check [email or web address] for group call information for [facility]   Edilia Gordon, PA-C [phone] (office) [email or web address]

## 2024-01-13 ENCOUNTER — Other Ambulatory Visit (HOSPITAL_COMMUNITY): Payer: Self-pay

## 2024-01-13 MED ORDER — ACETAMINOPHEN 500 MG PO TABS: 1000.0000 mg | ORAL_TABLET | Freq: Four times a day (QID) | ORAL | Status: AC | PRN

## 2024-01-13 NOTE — Plan of Care (Signed)

## 2024-01-13 NOTE — Plan of Care (Signed)

## 2024-01-13 NOTE — TOC CM/SW Note (Signed)
 Patient decided he does not want the tub bench or the 3 in 1. NCM called Jermaine with [employer] to pick up same.

## 2024-01-13 NOTE — Progress Notes (Signed)
 Physical Therapy Treatment Patient Details Name: Shawn Proctor MRN: [id number] DOB: 10-13-1980 Today's Date: 01/13/2024   History of Present Illness Pt is a 43 y/o male presenting on 5/11 after tripping and rolling his R ankle. Found with R tibia fx, 5/12 s/p ORIF R tibial shaft. Post OP WBAT in walking boot. PMH includes HIV.    PT Comments  Pt received in supine and agreeable to session. Pt demonstrates good progress towards goals this session. Pt able to significantly increase gait distance using crutches with slightly improved RLE WB. Pt able to participate in stair trial using crutches requiring intermittent min A with mild LOB. Pt also educated on sideways with rail and scooting techniques. Pt noted to increase ambulation speed at end of gait trial due to increased pain and fatigue requiring cues for pacing and safety. Anticipate pt and family will be able to manage pt's mobility needs at home once medically ready for discharge.    If plan is discharge home, recommend the following: A little help with walking and/or transfers;A little help with bathing/dressing/bathroom   Can travel by private vehicle        Equipment Recommendations  Rolling walker (2 wheels);BSC/3in1;Crutches    Recommendations for Other Services       Precautions / Restrictions Precautions Precautions: Fall Recall of Precautions/Restrictions: Intact Required Braces or Orthoses: Other Brace Other Brace: CAM walker Restrictions Weight Bearing Restrictions Per Provider Order: Yes RLE Weight Bearing Per Provider Order: Weight bearing as tolerated Other Position/Activity Restrictions: in CAM walker     Mobility  Bed Mobility Overal bed mobility: Needs Assistance Bed Mobility: Supine to Sit, Sit to Supine     Supine to sit: Supervision, HOB elevated Sit to supine: Supervision, HOB elevated        Transfers Overall transfer level: Needs assistance Equipment used: Crutches Transfers: Sit to/from  Stand Sit to Stand: Contact guard assist           General transfer comment: from EOB with cues for hand placement and safety    Ambulation/Gait Ambulation/Gait assistance: Contact guard assist Gait Distance (Feet): 230 Feet Assistive device: Crutches Gait Pattern/deviations: Step-to pattern, Decreased stance time - right, Decreased weight shift to right, Decreased step length - left, Antalgic       General Gait Details: Pt demonstrates very little RLE WB tolerance, however improves with progressed distance. Cues for pacing at end of gait trial due to pt moving quickly due to increased pain.   Stairs Stairs: Yes Stairs assistance: Min assist Stair Management: With crutches, Forwards Number of Stairs: 8 General stair comments: Pt instructed in technique with crutches and is able to demonstrate with 2 mild LOB requiring min A to correct. Pt instructed in guarding technique for support person. Pt verbally instructed in scooting technique   Wheelchair Mobility     Tilt Bed    Modified Rankin (Stroke Patients Only)       Balance Overall balance assessment: Needs assistance Sitting-balance support: No upper extremity supported, Feet supported Sitting balance-Leahy Scale: Good Sitting balance - Comments: sitting EOB   Standing balance support: Bilateral upper extremity supported, During functional activity, Reliant on assistive device for balance Standing balance-Leahy Scale: Fair Standing balance comment: with crutch support                            Communication Communication Communication: No apparent difficulties  Cognition Arousal: Alert Behavior During Therapy: WFL for tasks assessed/performed  Cueing Cueing Techniques: Verbal cues, Visual cues  Exercises      General Comments        Pertinent Vitals/Pain Pain Assessment Pain Assessment: 0-10 Pain Score: 7  Pain Location: R LE post  ambulation Pain Descriptors / Indicators: Discomfort, Operative site guarding, Grimacing Pain Intervention(s): Limited activity within patient's tolerance, Monitored during session, Repositioned     PT Goals (current goals can now be found in the care plan section) Acute Rehab PT Goals Patient Stated Goal: be able to manage at home (between [facility] and [place]) PT Goal Formulation: With patient Time For Goal Achievement: 01/25/24 Progress towards PT goals: Progressing toward goals    Frequency    Min 5X/week       AM-PAC PT "6 Clicks" Mobility   Outcome Measure  Help needed turning from your back to your side while in a flat bed without using bedrails?: None Help needed moving from lying on your back to sitting on the side of a flat bed without using bedrails?: None Help needed moving to and from a bed to a chair (including a wheelchair)?: A Little Help needed standing up from a chair using your arms (e.g., wheelchair or bedside chair)?: A Little Help needed to walk in hospital room?: A Little Help needed climbing 3-5 steps with a railing? : A Little 6 Click Score: 20    End of Session Equipment Utilized During Treatment: Gait belt Activity Tolerance: Patient tolerated treatment well Patient left: in bed;with call bell/phone within reach;with nursing/sitter in room Nurse Communication: Mobility status PT Visit Diagnosis: Unsteadiness on feet (R26.81);Other abnormalities of gait and mobility (R26.89)     Time: 1610-9604 PT Time Calculation (min) (ACUTE ONLY): 17 min  Charges:    $Gait Training: 8-22 mins PT General Charges $$ ACUTE PT VISIT: 1 Visit                     Michaelle Adolphus, PTA Acute Rehabilitation Services Secure Chat Preferred  [phone]    Michaelle Adolphus 01/13/2024, 10:17 AM

## 2024-01-13 NOTE — Progress Notes (Signed)
 Unit manager discharged the patient. TOC meds were given to the patient. Assisted with transporting the patient downstairs with [employer]. He safely transported in vehicle.

## 2024-01-16 LAB — HIV-1/HIV-2 QUAL RNA
HIV-1 RNA, Qualitative: REACTIVE — AB
HIV-2 RNA, Qualitative: NONREACTIVE
